# Patient Record
Sex: Male | Born: 1985 | Race: Black or African American | Hispanic: No | Marital: Single | Smoking: Current every day smoker
Health system: Southern US, Community
[De-identification: ages and names within clinical notes are randomized; demographics above are authoritative.]

---
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
## 2005-09-07 ENCOUNTER — Emergency Department (HOSPITAL_COMMUNITY): Admission: EM | Admit: 2005-09-07 | Discharge: 2005-09-07 | Payer: Self-pay | Admitting: Emergency Medicine

## 2007-11-10 ENCOUNTER — Emergency Department (HOSPITAL_COMMUNITY): Admission: EM | Admit: 2007-11-10 | Discharge: 2007-11-10 | Payer: Self-pay | Admitting: Emergency Medicine

## 2007-11-30 ENCOUNTER — Emergency Department (HOSPITAL_COMMUNITY): Admission: EM | Admit: 2007-11-30 | Discharge: 2007-11-30 | Payer: Self-pay | Admitting: Emergency Medicine

## 2010-02-09 ENCOUNTER — Emergency Department (HOSPITAL_COMMUNITY)
Admission: EM | Admit: 2010-02-09 | Discharge: 2010-02-09 | Payer: Self-pay | Source: Home / Self Care | Admitting: Emergency Medicine

## 2010-05-18 LAB — CBC
Hemoglobin: 15.6 g/dL (ref 13.0–17.0)
MCH: 30.8 pg (ref 26.0–34.0)
MCHC: 34.8 g/dL (ref 30.0–36.0)
Platelets: 225 10*3/uL (ref 150–400)
RBC: 5.06 MIL/uL (ref 4.22–5.81)

## 2010-05-18 LAB — URINALYSIS, ROUTINE W REFLEX MICROSCOPIC
Bilirubin Urine: NEGATIVE
Glucose, UA: NEGATIVE mg/dL
Hgb urine dipstick: NEGATIVE
Ketones, ur: NEGATIVE mg/dL
Nitrite: NEGATIVE
Protein, ur: NEGATIVE mg/dL
Specific Gravity, Urine: 1.01 (ref 1.005–1.030)
Urobilinogen, UA: 1 mg/dL (ref 0.0–1.0)
pH: 6.5 (ref 5.0–8.0)

## 2010-05-18 LAB — DIFFERENTIAL
Basophils Relative: 1 % (ref 0–1)
Eosinophils Absolute: 0.2 10*3/uL (ref 0.0–0.7)
Lymphs Abs: 3.1 10*3/uL (ref 0.7–4.0)
Monocytes Relative: 11 % (ref 3–12)
Neutro Abs: 2.6 10*3/uL (ref 1.7–7.7)
Neutrophils Relative %: 39 % — ABNORMAL LOW (ref 43–77)

## 2010-05-18 LAB — BASIC METABOLIC PANEL
CO2: 24 mEq/L (ref 19–32)
Calcium: 9.3 mg/dL (ref 8.4–10.5)
Creatinine, Ser: 0.94 mg/dL (ref 0.4–1.5)
GFR calc Af Amer: >60 mL/min (ref >60)
GFR calc non Af Amer: >60 mL/min (ref >60)
Sodium: 138 mEq/L (ref 135–145)

## 2010-12-07 LAB — CBC
Platelets: 260
RBC: 5.12
WBC: 8.9

## 2010-12-07 LAB — DIFFERENTIAL
Basophils Absolute: 0
Basophils Relative: 0
Eosinophils Absolute: 0.1
Eosinophils Relative: 1
Lymphocytes Relative: 27
Monocytes Absolute: 0.4

## 2010-12-07 LAB — URINALYSIS, ROUTINE W REFLEX MICROSCOPIC
Bilirubin Urine: NEGATIVE
Hgb urine dipstick: NEGATIVE
Ketones, ur: 15 — AB
Specific Gravity, Urine: >1.03 — ABNORMAL HIGH
Urobilinogen, UA: 1

## 2010-12-07 LAB — COMPREHENSIVE METABOLIC PANEL
ALT: 14
AST: 23
Albumin: 4.3
Alkaline Phosphatase: 68
CO2: 27
Chloride: 105
GFR calc Af Amer: >60
GFR calc non Af Amer: >60
Potassium: 3.7
Total Bilirubin: 1.2

## 2010-12-07 LAB — ETHANOL: Alcohol, Ethyl (B): <5

## 2014-09-23 ENCOUNTER — Emergency Department (HOSPITAL_COMMUNITY)
Admission: EM | Admit: 2014-09-23 | Discharge: 2014-09-23 | Disposition: A | Discharge status: Home / Self Care | Payer: BLUE CROSS/BLUE SHIELD | Attending: Emergency Medicine | Admitting: Emergency Medicine

## 2014-09-23 ENCOUNTER — Emergency Department (HOSPITAL_COMMUNITY): Payer: BLUE CROSS/BLUE SHIELD

## 2014-09-23 ENCOUNTER — Encounter (HOSPITAL_COMMUNITY): Payer: Self-pay | Admitting: Emergency Medicine

## 2014-09-23 DIAGNOSIS — S46911A Strain of unspecified muscle, fascia and tendon at shoulder and upper arm level, right arm, initial encounter: Secondary | ICD-10-CM | POA: Diagnosis not present

## 2014-09-23 DIAGNOSIS — Y9289 Other specified places as the place of occurrence of the external cause: Secondary | ICD-10-CM | POA: Insufficient documentation

## 2014-09-23 DIAGNOSIS — Y9389 Activity, other specified: Secondary | ICD-10-CM | POA: Insufficient documentation

## 2014-09-23 DIAGNOSIS — Z720 Tobacco use: Secondary | ICD-10-CM | POA: Diagnosis not present

## 2014-09-23 DIAGNOSIS — X58XXXA Exposure to other specified factors, initial encounter: Secondary | ICD-10-CM | POA: Diagnosis not present

## 2014-09-23 DIAGNOSIS — Y998 Other external cause status: Secondary | ICD-10-CM | POA: Diagnosis not present

## 2014-09-23 DIAGNOSIS — S4991XA Unspecified injury of right shoulder and upper arm, initial encounter: Secondary | ICD-10-CM | POA: Diagnosis present

## 2014-09-23 MED ORDER — NAPROXEN 500 MG PO TABS: 500.0000 mg | ORAL_TABLET | Freq: Two times a day (BID) | ORAL | Status: DC

## 2014-09-23 MED ORDER — NAPROXEN 250 MG PO TABS
500.0000 mg | ORAL_TABLET | Freq: Once | ORAL | Status: AC
  Administered 2014-09-23: 500 mg via ORAL
  Filled 2014-09-23: qty 2

## 2014-09-23 NOTE — Discharge Instructions (Signed)

## 2014-09-23 NOTE — ED Provider Notes (Signed)
CSN: [id number]     Arrival date & time 09/23/14  1459 History  This chart was scribed for non-physician practitioner, Burgess Amor, PA-C working with Gilda Crease, MD by Gwenyth Ober, ED scribe. This patient was seen in room APFT23/APFT23 and the patient's care was started at 4:48 PM   Chief Complaint  Patient presents with  . Shoulder Pain   The history is provided by the patient. No language interpreter was used.    HPI Comments: Dylan Fowler is a 29 y.o. male who presents to the Emergency Department complaining of constant, gradually improving right shoulder pain that started 2 days ago. He states decreased right grip strength as an associated symptom. Pt reports his pain becomes worse with movement. He has tried Tylenol and rest with no relief. Pt works as a Administrator, sports at [employer]. He denies any recent injuries and falls. Pt also denies tingling, numbness, neck or back pain as associated symptoms.  No PCP  History reviewed. No pertinent past medical history. History reviewed. No pertinent past surgical history. History reviewed. No pertinent family history. History  Substance Use Topics  . Smoking status: Current Every Day Smoker -- 0.50 packs/day    Types: Cigarettes  . Smokeless tobacco: Not on file  . Alcohol Use: Yes     Comment: 40 oz daily    Review of Systems  Constitutional: Negative for fever.  Musculoskeletal: Positive for arthralgias. Negative for myalgias and joint swelling.  Neurological: Negative for weakness and numbness.  All other systems reviewed and are negative.  Allergies  Tomato  Home Medications   Prior to Admission medications   Medication Sig Start Date End Date Taking? Authorizing Provider  acetaminophen (TYLENOL) 500 MG tablet Take 500 mg by mouth every 6 (six) hours as needed for mild pain or moderate pain.   Yes Historical Provider, MD  naproxen (NAPROSYN) 500 MG tablet Take 1 tablet  (500 mg total) by mouth 2 (two) times daily. 09/23/14   Burgess Amor, PA-C   BP 155/96 mmHg  Pulse 75  Temp(Src) 98.9 F (37.2 C) (Oral)  Resp 16  Ht  (1.676 m)  Wt 130 lb (58.968 kg)  BMI 20.99 kg/m2  SpO2 100% Physical Exam  Constitutional: He appears well-developed and well-nourished.  HENT:  Head: Atraumatic.  Neck: Normal range of motion.  Cardiovascular: Normal rate.   Pulses equal bilaterally  Musculoskeletal: He exhibits tenderness.  TTP along anterior right shoulder; no palpable deformity; no edema or erythema; no crepitus with ROM; pain is worse with active and passive flexion and abduction; full radial pulse less then 2 s distal cap refill; equal grip strength  Neurological: He is alert. He has normal strength. He displays normal reflexes. No sensory deficit.  Skin: Skin is warm and dry.  Psychiatric: He has a normal mood and affect.  Nursing note and vitals reviewed.   ED Course  Procedures   DIAGNOSTIC STUDIES: Oxygen Saturation is 100% on RA, normal by my interpretation.    COORDINATION OF CARE: 4:54 PM Discussed treatment plan with pt which includes an x-ray of his right shoulder. Advised pt to apply ice, take anti-inflammatories and do light duty for 1 week. Will refer to orthopedist, if no improvement. Pt agreed to plan.  5:56 PM Discussed x-ray results and discharge with instructions with pt. Pt agrees to plan.  Labs Review Labs Reviewed - No data to display  Imaging Review Dg Shoulder Right  09/23/2014  CLINICAL DATA:  Right shoulder pain 2 days.  No injury.  EXAM: RIGHT SHOULDER - 2+ VIEW  COMPARISON:  None.  FINDINGS: There is no evidence of fracture or dislocation. There is no evidence of arthropathy or other focal bone abnormality. Soft tissues are unremarkable.  IMPRESSION: Negative.   Electronically Signed   By: Elberta Fortisaniel  Boyle M.D.   On: 09/23/2014 17:18     EKG Interpretation None      MDM   Final diagnoses:  Shoulder strain, right,  initial encounter    Patients labs and/or radiological studies were reviewed and considered during the medical decision making and disposition process.  Results were also discussed with patient.  Pain associated with chronic overuse of the right shoulder.  Cannot rule out the possibility of a rotator cuff injury which was discussed with patient.  He was prescribed naproxen, encouraged heat therapy to the shoulder.  Minimize overhead use or any activity that worsens his symptoms, he was given a work note to cover him for these restrictions for the next week.  He was referred to orthopedics for further evaluation if his symptoms persist despite treatment or worsen.  Patient understands and agrees with plan.  I personally performed the services described in this documentation, which was scribed in my presence. The recorded information has been reviewed and is accurate.   Burgess AmorJulie Yoseph Haile, PA-C 09/25/14 0031  Burgess AmorJulie Rosely Fernandez, PA-C 09/25/14 0128  Gilda Creasehristopher J Pollina, MD 09/26/14 [phone]

## 2014-09-23 NOTE — ED Notes (Signed)
PT c/o right shoulder pain with ROM x2 days and denies any recent injury.

## 2015-04-07 ENCOUNTER — Emergency Department (HOSPITAL_COMMUNITY): Payer: BLUE CROSS/BLUE SHIELD

## 2015-04-07 ENCOUNTER — Encounter (HOSPITAL_COMMUNITY): Payer: Self-pay | Admitting: Emergency Medicine

## 2015-04-07 ENCOUNTER — Emergency Department (HOSPITAL_COMMUNITY)
Admission: EM | Admit: 2015-04-07 | Discharge: 2015-04-07 | Disposition: A | Discharge status: Home / Self Care | Payer: BLUE CROSS/BLUE SHIELD | Attending: Emergency Medicine | Admitting: Emergency Medicine

## 2015-04-07 DIAGNOSIS — S60221A Contusion of right hand, initial encounter: Secondary | ICD-10-CM | POA: Diagnosis not present

## 2015-04-07 DIAGNOSIS — Y9389 Activity, other specified: Secondary | ICD-10-CM | POA: Diagnosis not present

## 2015-04-07 DIAGNOSIS — W228XXA Striking against or struck by other objects, initial encounter: Secondary | ICD-10-CM | POA: Insufficient documentation

## 2015-04-07 DIAGNOSIS — Y998 Other external cause status: Secondary | ICD-10-CM | POA: Diagnosis not present

## 2015-04-07 DIAGNOSIS — Z791 Long term (current) use of non-steroidal anti-inflammatories (NSAID): Secondary | ICD-10-CM | POA: Diagnosis not present

## 2015-04-07 DIAGNOSIS — Y9289 Other specified places as the place of occurrence of the external cause: Secondary | ICD-10-CM | POA: Insufficient documentation

## 2015-04-07 DIAGNOSIS — F17210 Nicotine dependence, cigarettes, uncomplicated: Secondary | ICD-10-CM | POA: Diagnosis not present

## 2015-04-07 DIAGNOSIS — S6991XA Unspecified injury of right wrist, hand and finger(s), initial encounter: Secondary | ICD-10-CM | POA: Diagnosis present

## 2015-04-07 MED ORDER — ACETAMINOPHEN 325 MG PO TABS
650.0000 mg | ORAL_TABLET | Freq: Once | ORAL | Status: AC
  Administered 2015-04-07: 650 mg via ORAL
  Filled 2015-04-07: qty 2

## 2015-04-07 MED ORDER — IBUPROFEN 800 MG PO TABS
800.0000 mg | ORAL_TABLET | Freq: Once | ORAL | Status: AC
  Administered 2015-04-07: 800 mg via ORAL
  Filled 2015-04-07: qty 1

## 2015-04-07 NOTE — ED Provider Notes (Signed)
CSN: [id number]     Arrival date & time 04/07/15  1632 History  By signing my name below, I, Murriel Hopper, attest that this documentation has been prepared under the direction and in the presence of Ivery Quale, PA-C.  Electronically Signed: Murriel Hopper, ED Scribe. 04/07/2015. 5:00 PM.    Chief Complaint  Patient presents with  . Hand Injury      Patient is a 30 y.o. male presenting with hand injury. The history is provided by the patient. No language interpreter was used.  Hand Injury Location:  Hand Hand location:  R hand and dorsum of R hand Pain details:    Radiates to:  Does not radiate   Severity:  Moderate   Onset quality:  Sudden   Duration:  1 day   Timing:  Constant   Progression:  Unchanged Chronicity:  New Handedness:  Right-handed Dislocation: no   Foreign body present:  No foreign bodies Tetanus status:  Unknown Prior injury to area:  No Relieved by:  None tried Worsened by:  Movement Ineffective treatments:  None tried Associated symptoms: decreased range of motion    HPI Comments: Dylan Fowler is a 30 y.o. male who presents to the Emergency Department complaining of a constant right hand injury that has been present since last night at 2200. Pt reports he punched a metal cabinet last night out of anger, and woke up this morning with swelling to the lateral aspect of his right hand. Pt states his pain worsens when he puts his hand into a fist. Pt denies taking any medications or doing anything to treat his pain prior to coming to ED. Pt denies any other symptoms.  History reviewed. No pertinent past medical history. History reviewed. No pertinent past surgical history. History reviewed. No pertinent family history. Social History  Substance Use Topics  . Smoking status: Current Every Day Smoker -- 0.50 packs/day    Types: Cigarettes  . Smokeless tobacco: None  . Alcohol Use: Yes     Comment: 40 oz daily    Review of Systems  Musculoskeletal:  Positive for myalgias, joint swelling and arthralgias.  All other systems reviewed and are negative.     Allergies  Tomato  Home Medications   Prior to Admission medications   Medication Sig Start Date End Date Taking? Authorizing Provider  acetaminophen (TYLENOL) 500 MG tablet Take 500 mg by mouth every 6 (six) hours as needed for mild pain or moderate pain.    Historical Provider, MD  naproxen (NAPROSYN) 500 MG tablet Take 1 tablet (500 mg total) by mouth 2 (two) times daily. 09/23/14   Burgess Amor, PA-C   BP 134/93 mmHg  Pulse 76  Temp(Src) 100.2 F (37.9 C) (Tympanic)  Resp 14  Ht  (1.727 m)  Wt 130 lb (58.968 kg)  BMI 19.77 kg/m2  SpO2 100% Physical Exam  Constitutional: He is oriented to person, place, and time. He appears well-developed and well-nourished.  HENT:  Head: Normocephalic and atraumatic.  Cardiovascular: Normal rate.   Pulmonary/Chest: Effort normal.  Abdominal: He exhibits no distension.  Musculoskeletal: He exhibits tenderness.  Capillary refill <2 seconds No deformity of PIP joint of fingers of the right hand  Tenderness to palpation of 5th Carpal bone Abrasion to the 4th MP joint Full ROM of wrist No deformity of the forearm or elbow  Neurological: He is alert and oriented to person, place, and time.  Skin: Skin is warm and dry.  Psychiatric: He has a normal  mood and affect.  Nursing note and vitals reviewed.   ED Course  Procedures (including critical care time)  DIAGNOSTIC STUDIES: Oxygen Saturation is 100% on room air, normal by my interpretation.    COORDINATION OF CARE: 4:56 PM Discussed treatment plan with pt at bedside and pt agreed to plan.   Labs Review Labs Reviewed - No data to display  Imaging Review Dg Hand Complete Right  04/07/2015  CLINICAL DATA:  30 year old who punched a metal door with his right hand yesterday. Pain localizing to the 3rd through 5th MCP joints. Initial encounter. EXAM: RIGHT HAND - COMPLETE 3+  VIEW COMPARISON:  None. FINDINGS: No evidence of acute fracture or dislocation. Joint spaces well preserved. Well-preserved bone mineral density. No intrinsic osseous abnormalities. IMPRESSION: Normal examination. Electronically Signed   By: Hulan Saas M.D.   On: 04/07/2015 16:56   I have personally reviewed and evaluated these images and lab results as part of my medical decision-making.    MDM  Xray is negative for fracture or dislocation. Pt has temp of 100.2. Pt advised to monitor this over the next few days.   Final diagnoses:  None  5:20 PM X-ray negative for fracture. Pt advised to ice his hand, and will be given ACE bandage to wrap it up.    **I personally performed the services described in this documentation, which was scribed in my presence. The recorded information has been reviewed and is accurate.Ivery Quale, PA-C 04/07/15 1808  Glynn Octave, MD 04/07/15 [phone]

## 2015-04-07 NOTE — Discharge Instructions (Signed)
Your xray is negative for fracture or dislocation. Your temp is slightly elevated. Please monitor this closely. Use ace wrap for 3 to 5 days. Use ibuprofen and tylenol every 6 hours for discomfort. Hand Contusion  A hand contusion is a deep bruise to the hand. Contusions happen when an injury causes bleeding under the skin. Signs of bruising include pain, puffiness (swelling), and discolored skin. The contusion may turn blue, purple, or yellow. HOME CARE  Put ice on the injured area.  Put ice in a plastic bag.  Place a towel between your skin and the bag.  Leave the ice on for 15-20 minutes, 03-04 times a day.  Only take medicines as told by your doctor.  Use an elastic wrap only as told. You may remove the wrap for sleeping, showering, and bathing. Take the wrap off if you lose feeling (have numbness) in your fingers, or they turn blue or cold. Put the wrap on more loosely.  Keep the hand raised (elevated) with pillows.  Avoid using your hand too much if it is painful. GET HELP RIGHT AWAY IF:   You have more redness, puffiness, or pain in your hand.  Your puffiness or pain does not get better with medicine.  You lose feeling in your hand, or you cannot move your fingers.  Your hand turns cold or blue.  You have pain when you move your fingers.  Your hand feels warm.  Your contusion does not get better in 2 days. MAKE SURE YOU:   Understand these instructions.  Will watch this condition.  Will get help right away if you are not doing well or you get worse.   This information is not intended to replace advice given to you by your health care provider. Make sure you discuss any questions you have with your health care provider.   Document Released: 08/11/2007 Document Revised: 03/15/2014 Document Reviewed: 08/16/2011 Elsevier Interactive Patient Education [employer]

## 2015-04-07 NOTE — ED Notes (Signed)
PA at bedside.

## 2015-04-07 NOTE — ED Notes (Signed)
Pt punched a door last night out of anger. Pt has pain to right hand along third and fouth knuckles, no deformity noted.

## 2015-05-02 ENCOUNTER — Encounter (HOSPITAL_COMMUNITY): Payer: Self-pay | Admitting: Emergency Medicine

## 2015-05-02 ENCOUNTER — Emergency Department (HOSPITAL_COMMUNITY)
Admission: EM | Admit: 2015-05-02 | Discharge: 2015-05-02 | Disposition: A | Discharge status: Home / Self Care | Payer: BLUE CROSS/BLUE SHIELD | Attending: Emergency Medicine | Admitting: Emergency Medicine

## 2015-05-02 DIAGNOSIS — F17210 Nicotine dependence, cigarettes, uncomplicated: Secondary | ICD-10-CM | POA: Diagnosis not present

## 2015-05-02 DIAGNOSIS — K029 Dental caries, unspecified: Secondary | ICD-10-CM | POA: Diagnosis not present

## 2015-05-02 DIAGNOSIS — K0889 Other specified disorders of teeth and supporting structures: Secondary | ICD-10-CM | POA: Diagnosis present

## 2015-05-02 DIAGNOSIS — K047 Periapical abscess without sinus: Secondary | ICD-10-CM

## 2015-05-02 MED ORDER — IBUPROFEN 800 MG PO TABS
800.0000 mg | ORAL_TABLET | Freq: Once | ORAL | Status: AC
  Administered 2015-05-02: 800 mg via ORAL
  Filled 2015-05-02: qty 1

## 2015-05-02 MED ORDER — IBUPROFEN 800 MG PO TABS: 800.0000 mg | ORAL_TABLET | Freq: Three times a day (TID) | ORAL | Status: DC

## 2015-05-02 MED ORDER — ACETAMINOPHEN 325 MG PO TABS
650.0000 mg | ORAL_TABLET | Freq: Once | ORAL | Status: AC
  Administered 2015-05-02: 650 mg via ORAL
  Filled 2015-05-02: qty 2

## 2015-05-02 MED ORDER — LIDOCAINE HCL (PF) 1 % IJ SOLN
INTRAMUSCULAR | Status: AC
  Filled 2015-05-02: qty 5

## 2015-05-02 MED ORDER — TRAMADOL HCL 50 MG PO TABS: 50.0000 mg | ORAL_TABLET | Freq: Four times a day (QID) | ORAL | Status: DC | PRN

## 2015-05-02 MED ORDER — AMOXICILLIN 500 MG PO CAPS: 500.0000 mg | ORAL_CAPSULE | Freq: Three times a day (TID) | ORAL | Status: DC

## 2015-05-02 MED ORDER — CEFTRIAXONE SODIUM 1 G IJ SOLR
1.0000 g | Freq: Once | INTRAMUSCULAR | Status: AC
  Administered 2015-05-02: 1 g via INTRAMUSCULAR
  Filled 2015-05-02: qty 10

## 2015-05-02 NOTE — Discharge Instructions (Signed)
It is important that she see a dentist as soon as possible concerning your dental infection. Use Amoxil and ibuprofen daily with food. Use Ultram for more severe pain. This medication may cause drowsiness, please use with caution. Dental Caries Dental caries is tooth decay. This decay can cause a hole in teeth (cavity) that can get bigger and deeper over time. HOME CARE  Brush and floss your teeth. Do this at least two times a day.  Use a fluoride toothpaste.  Use a mouth rinse if told by your dentist or doctor.  Eat less sugary and starchy foods. Drink less sugary drinks.  Avoid snacking often on sugary and starchy foods. Avoid sipping often on sugary drinks.  Keep regular checkups and cleanings with your dentist.  Use fluoride supplements if told by your dentist or doctor.  Allow fluoride to be applied to teeth if told by your dentist or doctor.   This information is not intended to replace advice given to you by your health care provider. Make sure you discuss any questions you have with your health care provider.   Document Released: 12/02/2007 Document Revised: 03/15/2014 Document Reviewed: 02/25/2012 Elsevier Interactive Patient Education [employer]

## 2015-05-02 NOTE — ED Provider Notes (Signed)
CSN: [id number]     Arrival date & time 05/02/15  1030 History   First MD Initiated Contact with Patient 05/02/15 1308     Chief Complaint  Patient presents with  . Dental Pain     (Consider location/radiation/quality/duration/timing/severity/associated sxs/prior Treatment) Patient is a 29 y.o. male presenting with tooth pain. The history is provided by the patient.  Dental Pain Location:  Upper Severity:  Moderate Onset quality:  Gradual Duration:  3 days Timing:  Intermittent Progression:  Worsening Chronicity:  New Context: dental caries   Context: not recent dental surgery   Relieved by:  Nothing Worsened by:  Nothing tried Associated symptoms: facial pain, facial swelling and gum swelling   Associated symptoms: no fever and no trismus   Risk factors: lack of dental care   Risk factors: no diabetes and no immunosuppression     History reviewed. No pertinent past medical history. History reviewed. No pertinent past surgical history. History reviewed. No pertinent family history. Social History  Substance Use Topics  . Smoking status: Current Every Day Smoker -- 0.50 packs/day    Types: Cigarettes  . Smokeless tobacco: None  . Alcohol Use: Yes     Comment: 40 oz daily    Review of Systems  Constitutional: Negative for fever.  HENT: Positive for dental problem and facial swelling.   All other systems reviewed and are negative.     Allergies  Tomato  Home Medications   Prior to Admission medications   Medication Sig Start Date End Date Taking? Authorizing Provider  acetaminophen (TYLENOL) 500 MG tablet Take 500 mg by mouth every 6 (six) hours as needed for mild pain or moderate pain.    Historical Provider, MD  naproxen (NAPROSYN) 500 MG tablet Take 1 tablet (500 mg total) by mouth 2 (two) times daily. Patient not taking: Reported on 05/02/2015 09/23/14   Burgess Amor, PA-C   BP 128/90 mmHg  Pulse 73  Temp(Src) 98.4 F (36.9 C) (Oral)  Resp 18  Ht   (1.676 m)  Wt 58.968 kg  BMI 20.99 kg/m2  SpO2 100% Physical Exam  Constitutional: He is oriented to person, place, and time. He appears well-developed and well-nourished.  Non-toxic appearance.  HENT:  Head: Normocephalic.  Right Ear: Tympanic membrane and external ear normal.  Left Ear: Tympanic membrane and external ear normal.  There is mild-to-moderate swelling of the right lower jaw.  There is a deep cavity of the right lower molar. The third molar is at an angle. There is swelling of the gum, but no visible abscess. There is no swelling under the tongue. The airway is patent. There is no trismus.  Eyes: EOM and lids are normal. Pupils are equal, round, and reactive to light.  Neck: Normal range of motion. Neck supple. Carotid bruit is not present.  Cardiovascular: Normal rate, regular rhythm, normal heart sounds, intact distal pulses and normal pulses.   Pulmonary/Chest: Breath sounds normal. No respiratory distress.  Abdominal: Soft. Bowel sounds are normal. There is no tenderness. There is no guarding.  Musculoskeletal: Normal range of motion.  Lymphadenopathy:       Head (right side): No submandibular adenopathy present.       Head (left side): No submandibular adenopathy present.    He has no cervical adenopathy.  Neurological: He is alert and oriented to person, place, and time. He has normal strength. No cranial nerve deficit or sensory deficit.  Skin: Skin is warm and dry.  Psychiatric: He has a  normal mood and affect. His speech is normal.  Nursing note and vitals reviewed.   ED Course  Procedures (including critical care time) Labs Review Labs Reviewed - No data to display  Imaging Review No results found. I have personally reviewed and evaluated these images and lab results as part of my medical decision-making.   EKG Interpretation None      MDM  Vital signs reviewed. Patient has dental caries of the right lower jaw. There is swelling of the right lower  face. There is no evidence for Ludwig's angina. airway is patent.  The patient was treated in the emergency department with Rocephin. Prescription for amoxicillin and ibuprofen 800 given to the patient, as well as Ultram.    Final diagnoses:  None    **I have reviewed nursing notes, vital signs, and all appropriate lab and imaging results for this patient.Ivery Quale, PA-C 05/02/15 1333  Bethann Berkshire, MD 05/03/15 [phone]

## 2015-05-02 NOTE — ED Notes (Signed)
C/o right lower dental pain, rates pain 6/10.

## 2015-05-08 ENCOUNTER — Encounter (HOSPITAL_COMMUNITY): Payer: Self-pay | Admitting: *Deleted

## 2015-05-08 ENCOUNTER — Emergency Department (HOSPITAL_COMMUNITY)
Admission: EM | Admit: 2015-05-08 | Discharge: 2015-05-08 | Disposition: A | Discharge status: Home / Self Care | Payer: BLUE CROSS/BLUE SHIELD | Attending: Emergency Medicine | Admitting: Emergency Medicine

## 2015-05-08 DIAGNOSIS — R69 Illness, unspecified: Secondary | ICD-10-CM

## 2015-05-08 DIAGNOSIS — Z791 Long term (current) use of non-steroidal anti-inflammatories (NSAID): Secondary | ICD-10-CM | POA: Insufficient documentation

## 2015-05-08 DIAGNOSIS — M791 Myalgia: Secondary | ICD-10-CM | POA: Diagnosis present

## 2015-05-08 DIAGNOSIS — Z792 Long term (current) use of antibiotics: Secondary | ICD-10-CM | POA: Insufficient documentation

## 2015-05-08 DIAGNOSIS — F17210 Nicotine dependence, cigarettes, uncomplicated: Secondary | ICD-10-CM | POA: Insufficient documentation

## 2015-05-08 DIAGNOSIS — J111 Influenza due to unidentified influenza virus with other respiratory manifestations: Secondary | ICD-10-CM | POA: Diagnosis not present

## 2015-05-08 NOTE — Discharge Instructions (Signed)

## 2015-05-08 NOTE — ED Provider Notes (Signed)
CSN: [id number]     Arrival date & time 05/08/15  0704 History   First MD Initiated Contact with Patient 05/08/15 [phone]     Chief Complaint  Patient presents with  . Generalized Body Aches     (Consider location/radiation/quality/duration/timing/severity/associated sxs/prior Treatment) HPI   This is a previously healthy 30 year old male comes in today complaining of onset of chills, headache, body aches, sore throat, nasal congestion, and cough which began yesterday. He was at work and was able to work through his shift. He states he does not feel like going to work today. His son has had similar symptoms. He has not had a flu shot. He states when he tried to smoke a cigarette today he did not feel like it could smoke it. His cough is non-productive and he denies dyspnea. He denies any chest pain. He denies abdominal pain, nausea, vomiting or diarrhea. He has not taken any intervention. He has been trying to drink plenty of fluids to feel like he is staying hydrated.  History reviewed. No pertinent past medical history. History reviewed. No pertinent past surgical history. No family history on file. Social History  Substance Use Topics  . Smoking status: Current Every Day Smoker -- 0.50 packs/day    Types: Cigarettes  . Smokeless tobacco: None  . Alcohol Use: Yes     Comment: 40 oz daily    Review of Systems  All other systems reviewed and are negative.     Allergies  Tomato  Home Medications   Prior to Admission medications   Medication Sig Start Date End Date Taking? Authorizing Provider  acetaminophen (TYLENOL) 500 MG tablet Take 500 mg by mouth every 6 (six) hours as needed for mild pain or moderate pain.    Historical Provider, MD  amoxicillin (AMOXIL) 500 MG capsule Take 1 capsule (500 mg total) by mouth 3 (three) times daily. 05/02/15   Ivery Quale, PA-C  ibuprofen (ADVIL,MOTRIN) 800 MG tablet Take 1 tablet (800 mg total) by mouth 3 (three) times daily. 05/02/15   Ivery Quale, PA-C  naproxen (NAPROSYN) 500 MG tablet Take 1 tablet (500 mg total) by mouth 2 (two) times daily. Patient not taking: Reported on 05/02/2015 09/23/14   Burgess Amor, PA-C  traMADol (ULTRAM) 50 MG tablet Take 1 tablet (50 mg total) by mouth every 6 (six) hours as needed. 05/02/15   Ivery Quale, PA-C   BP 128/90 mmHg  Pulse 86  Temp(Src) 99.5 F (37.5 C) (Oral)  Resp 16  Ht  (1.676 m)  Wt 58.968 kg  BMI 20.99 kg/m2  SpO2 99% Physical Exam  Constitutional: He is oriented to person, place, and time. He appears well-developed and well-nourished.  HENT:  Head: Normocephalic and atraumatic.  Right Ear: External ear normal.  Left Ear: External ear normal.  Nose: Nose normal.  Eyes: Conjunctivae and EOM are normal. Pupils are equal, round, and reactive to light.  Neck: Normal range of motion. Neck supple. No tracheal deviation present. No thyromegaly present.  Cardiovascular: Normal rate, regular rhythm, normal heart sounds and intact distal pulses.   Pulmonary/Chest: Effort normal and breath sounds normal. No respiratory distress. He has no wheezes. He has no rales. He exhibits no tenderness.  Musculoskeletal: Normal range of motion.  Neurological: He is alert and oriented to person, place, and time.  Skin: Skin is warm and dry.  Psychiatric: He has a normal mood and affect. His behavior is normal. Judgment and thought content normal.  Nursing note and vitals  reviewed.   ED Course  Procedures (including critical care time) Labs Review Labs Reviewed - No data to display  MDM   Final diagnoses:  Influenza-like illness   30 year old male with recent exposure to similar infectious type symptoms. Currently there is high prevalence of flu in the county. He is advised regarding conservative therapy including acetaminophen, by mouth fluids, abstention from smoking, rest. He is advised regarding return precautions such as dyspnea, abdominal pain, or inability to keep fluids. 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 Questions asked and answered.    Margarita Grizzle, MD 05/08/15 0900

## 2015-05-08 NOTE — ED Notes (Signed)
Pt states he woke up yesterday morning with chills, headache, body ache, cough which is productive at times, yellow in color. States nausea when trying to smoke.

## 2015-06-24 ENCOUNTER — Emergency Department (HOSPITAL_COMMUNITY)
Admission: EM | Admit: 2015-06-24 | Discharge: 2015-06-24 | Disposition: A | Discharge status: Home / Self Care | Payer: BLUE CROSS/BLUE SHIELD | Attending: Emergency Medicine | Admitting: Emergency Medicine

## 2015-06-24 ENCOUNTER — Encounter (HOSPITAL_COMMUNITY): Payer: Self-pay | Admitting: *Deleted

## 2015-06-24 DIAGNOSIS — K089 Disorder of teeth and supporting structures, unspecified: Secondary | ICD-10-CM | POA: Insufficient documentation

## 2015-06-24 DIAGNOSIS — R112 Nausea with vomiting, unspecified: Secondary | ICD-10-CM | POA: Insufficient documentation

## 2015-06-24 DIAGNOSIS — Z792 Long term (current) use of antibiotics: Secondary | ICD-10-CM | POA: Diagnosis not present

## 2015-06-24 DIAGNOSIS — R531 Weakness: Secondary | ICD-10-CM | POA: Insufficient documentation

## 2015-06-24 DIAGNOSIS — F1010 Alcohol abuse, uncomplicated: Secondary | ICD-10-CM | POA: Diagnosis not present

## 2015-06-24 DIAGNOSIS — R197 Diarrhea, unspecified: Secondary | ICD-10-CM | POA: Diagnosis not present

## 2015-06-24 DIAGNOSIS — R1010 Upper abdominal pain, unspecified: Secondary | ICD-10-CM | POA: Diagnosis present

## 2015-06-24 DIAGNOSIS — F17210 Nicotine dependence, cigarettes, uncomplicated: Secondary | ICD-10-CM | POA: Diagnosis not present

## 2015-06-24 LAB — URINE MICROSCOPIC-ADD ON
BACTERIA UA: NONE SEEN
RBC / HPF: NONE SEEN RBC/hpf (ref 0–5)
WBC UA: NONE SEEN WBC/hpf (ref 0–5)

## 2015-06-24 LAB — URINALYSIS, ROUTINE W REFLEX MICROSCOPIC
Bilirubin Urine: NEGATIVE
GLUCOSE, UA: NEGATIVE mg/dL
HGB URINE DIPSTICK: NEGATIVE
LEUKOCYTES UA: NEGATIVE
Nitrite: NEGATIVE
Specific Gravity, Urine: >1.03 — ABNORMAL HIGH (ref 1.005–1.030)
pH: 5.5 (ref 5.0–8.0)

## 2015-06-24 LAB — COMPREHENSIVE METABOLIC PANEL
ALK PHOS: 65 U/L (ref 38–126)
ALT: 17 U/L (ref 17–63)
AST: 28 U/L (ref 15–41)
Albumin: 4.2 g/dL (ref 3.5–5.0)
Anion gap: 11 (ref 5–15)
BILIRUBIN TOTAL: 0.7 mg/dL (ref 0.3–1.2)
BUN: 7 mg/dL (ref 6–20)
CALCIUM: 8.7 mg/dL — AB (ref 8.9–10.3)
CO2: 22 mmol/L (ref 22–32)
CREATININE: 0.74 mg/dL (ref 0.61–1.24)
Chloride: 107 mmol/L (ref 101–111)
Glucose, Bld: 90 mg/dL (ref 65–99)
Potassium: 4 mmol/L (ref 3.5–5.1)
Sodium: 140 mmol/L (ref 135–145)
TOTAL PROTEIN: 7.8 g/dL (ref 6.5–8.1)

## 2015-06-24 LAB — RAPID URINE DRUG SCREEN, HOSP PERFORMED
Amphetamines: NOT DETECTED
BENZODIAZEPINES: NOT DETECTED
Barbiturates: NOT DETECTED
Cocaine: NOT DETECTED
Opiates: NOT DETECTED
Tetrahydrocannabinol: NOT DETECTED

## 2015-06-24 LAB — CBC WITH DIFFERENTIAL/PLATELET
BASOS ABS: 0.1 10*3/uL (ref 0.0–0.1)
BASOS PCT: 2 %
EOS ABS: 0.2 10*3/uL (ref 0.0–0.7)
EOS PCT: 3 %
HCT: 45.9 % (ref 39.0–52.0)
Hemoglobin: 15.2 g/dL (ref 13.0–17.0)
LYMPHS PCT: 47 %
Lymphs Abs: 3.2 10*3/uL (ref 0.7–4.0)
MCH: 30.5 pg (ref 26.0–34.0)
MCHC: 33.1 g/dL (ref 30.0–36.0)
MCV: 92.2 fL (ref 78.0–100.0)
MONO ABS: 0.7 10*3/uL (ref 0.1–1.0)
Monocytes Relative: 11 %
Neutro Abs: 2.5 10*3/uL (ref 1.7–7.7)
Neutrophils Relative %: 37 %
PLATELETS: 280 10*3/uL (ref 150–400)
RBC: 4.98 MIL/uL (ref 4.22–5.81)
RDW: 12.1 % (ref 11.5–15.5)
WBC: 6.7 10*3/uL (ref 4.0–10.5)

## 2015-06-24 LAB — LIPASE, BLOOD: LIPASE: 21 U/L (ref 11–51)

## 2015-06-24 LAB — ETHANOL: ALCOHOL ETHYL (B): 55 mg/dL — AB (ref <5)

## 2015-06-24 MED ORDER — ONDANSETRON HCL 4 MG PO TABS: 4.0000 mg | ORAL_TABLET | Freq: Four times a day (QID) | ORAL | Status: DC

## 2015-06-24 MED ORDER — FAMOTIDINE 20 MG PO TABS: 20.0000 mg | ORAL_TABLET | Freq: Two times a day (BID) | ORAL | Status: DC

## 2015-06-24 MED ORDER — ONDANSETRON HCL 4 MG/2ML IJ SOLN
4.0000 mg | Freq: Once | INTRAMUSCULAR | Status: AC
  Administered 2015-06-24: 4 mg via INTRAVENOUS
  Filled 2015-06-24: qty 2

## 2015-06-24 MED ORDER — FAMOTIDINE IN NACL 20-0.9 MG/50ML-% IV SOLN
20.0000 mg | Freq: Once | INTRAVENOUS | Status: AC
  Administered 2015-06-24: 20 mg via INTRAVENOUS
  Filled 2015-06-24: qty 50

## 2015-06-24 MED ORDER — SODIUM CHLORIDE 0.9 % IV BOLUS (SEPSIS)
1000.0000 mL | Freq: Once | INTRAVENOUS | Status: AC
  Administered 2015-06-24: 1000 mL via INTRAVENOUS

## 2015-06-24 NOTE — ED Notes (Signed)
Pt c/o abd pain with n/v/d. Reports he is taking PCN for dental infection. And has been exposed to "stomach bug" recently.

## 2015-06-24 NOTE — ED Provider Notes (Signed)
CSN: [id number]     Arrival date & time 06/24/15  4098 History   First MD Initiated Contact with Patient 06/24/15 [phone]     No chief complaint on file.    (Consider location/radiation/quality/duration/timing/severity/associated sxs/prior Treatment) HPI   Dylan Fowler is a 30 y.o. male who presents to the Emergency Department complaining of generalized weakness and upper abdominal pain for two days.  He reports having episodic vomiting and diarrhea as well.  He describes the abdominal pain as sharp and worse with movement and attempted fluid or food intake.  He also states that another family member recently had similar symptoms.  He also states that he has been taking PCN for a dental infection and he admits to daily alcohol intake.  He states that he drinks more than 40 oz of beer daily.  He denies fever, bloody or black stools or vomitus, chest pain, back pain or dysuria.     No past medical history on file. No past surgical history on file. No family history on file. Social History  Substance Use Topics  . Smoking status: Current Every Day Smoker -- 0.50 packs/day    Types: Cigarettes  . Smokeless tobacco: Not on file  . Alcohol Use: Yes     Comment: 40 oz daily    Review of Systems  Constitutional: Positive for chills and appetite change. Negative for fever.  HENT: Positive for dental problem. Negative for congestion and sore throat.   Respiratory: Negative for chest tightness and shortness of breath.   Cardiovascular: Negative for chest pain.  Gastrointestinal: Positive for nausea, vomiting, abdominal pain and diarrhea. Negative for blood in stool and abdominal distention.  Genitourinary: Negative for dysuria, flank pain, decreased urine volume and difficulty urinating.  Musculoskeletal: Negative for back pain.  Skin: Negative for color change and rash.  Neurological: Positive for weakness (generalized). Negative for dizziness, syncope, speech difficulty, numbness and  headaches.  Hematological: Negative for adenopathy.  All other systems reviewed and are negative.     Allergies  Tomato  Home Medications   Prior to Admission medications   Medication Sig Start Date End Date Taking? Authorizing Provider  acetaminophen (TYLENOL) 500 MG tablet Take 500 mg by mouth every 6 (six) hours as needed for mild pain or moderate pain.    Historical Provider, MD  amoxicillin (AMOXIL) 500 MG capsule Take 1 capsule (500 mg total) by mouth 3 (three) times daily. 05/02/15   Ivery Quale, PA-C  ibuprofen (ADVIL,MOTRIN) 800 MG tablet Take 1 tablet (800 mg total) by mouth 3 (three) times daily. 05/02/15   Ivery Quale, PA-C  naproxen (NAPROSYN) 500 MG tablet Take 1 tablet (500 mg total) by mouth 2 (two) times daily. Patient not taking: Reported on 05/02/2015 09/23/14   Burgess Amor, PA-C  traMADol (ULTRAM) 50 MG tablet Take 1 tablet (50 mg total) by mouth every 6 (six) hours as needed. 05/02/15   Ivery Quale, PA-C   BP 152/98 mmHg  Pulse 85  Temp(Src) 98.3 F (36.8 C) (Oral)  Resp 16  Ht  (1.676 m)  Wt 56.7 kg  BMI 20.19 kg/m2  SpO2 98% Physical Exam  Constitutional: He is oriented to person, place, and time. He appears well-developed and well-nourished. No distress.  Pt smells of ETOH  HENT:  Head: Normocephalic and atraumatic.  Mouth/Throat: Oropharynx is clear and moist.  Cardiovascular: Normal rate, regular rhythm, normal heart sounds and intact distal pulses.   No murmur heard. Pulmonary/Chest: Effort normal and breath sounds normal. No  respiratory distress.  Abdominal: Soft. Normal appearance. He exhibits no distension and no mass. There is no hepatosplenomegaly. There is tenderness in the epigastric area. There is no rigidity, no rebound, no guarding, no CVA tenderness and no tenderness at McBurney's point.  Musculoskeletal: Normal range of motion. He exhibits no edema.  Neurological: He is alert and oriented to person, place, and time. He exhibits  normal muscle tone. Coordination normal.  Skin: Skin is warm and dry.  Psychiatric: His behavior is normal.  Nursing note and vitals reviewed.   ED Course  Procedures (including critical care time) Labs Review Labs Reviewed  COMPREHENSIVE METABOLIC PANEL - Abnormal; Notable for the following:    Calcium 8.7 (*)    All other components within normal limits  ETHANOL - Abnormal; Notable for the following:    Alcohol, Ethyl (B) 55 (*)    All other components within normal limits  URINALYSIS, ROUTINE W REFLEX MICROSCOPIC (NOT AT [facility]) - Abnormal; Notable for the following:    Specific Gravity, Urine >1.030 (*)    Ketones, ur TRACE (*)    Protein, ur TRACE (*)    All other components within normal limits  URINE MICROSCOPIC-ADD ON - Abnormal; Notable for the following:    Squamous Epithelial / LPF 0-5 (*)    All other components within normal limits  LIPASE, BLOOD  CBC WITH DIFFERENTIAL/PLATELET  URINE RAPID DRUG SCREEN, HOSP PERFORMED    Imaging Review No results found. I have personally reviewed and evaluated these images and lab results as part of my medical decision-making.   EKG Interpretation None      MDM   Final diagnoses:  Non-intractable vomiting with nausea, vomiting of unspecified type  Alcohol abuse   0945  Pt remains well appearing.  Vitals stable.  No active vomiting in the ED.  abd remains soft, labs are reassuring.  Mother now at bedside.  Will try fluid challenge.    1050  Pt is feeling better and states he is ready to go home.  Will be discharge in mother's care.  No concerning sx's for surgical abdomen.  Counseled on alcohol abuse , referral info given for treatment centers.  Agrees to return if needed.   Pauline Ausammy Marcio Hoque, PA-C 06/26/15 2130  Zadie Rhineonald Wickline, MD 06/27/15 [phone]

## 2015-06-24 NOTE — ED Notes (Signed)
Pt tolerating po fluids (8oz ginger ale) well.

## 2015-06-24 NOTE — Discharge Instructions (Signed)
Nausea and Vomiting Nausea means you feel sick to your stomach. Throwing up (vomiting) is a reflex where stomach contents come out of your mouth. HOME CARE   Take medicine as told by your doctor.  Do not force yourself to eat. However, you do need to drink fluids.  If you feel like eating, eat a normal diet as told by your doctor.  Eat rice, wheat, potatoes, bread, lean meats, yogurt, fruits, and vegetables.  Avoid high-fat foods.  Drink enough fluids to keep your pee (urine) clear or pale yellow.  Ask your doctor how to replace body fluid losses (rehydrate). Signs of body fluid loss (dehydration) include:  Feeling very thirsty.  Dry lips and mouth.  Feeling dizzy.  Dark pee.  Peeing less than normal.  Feeling confused.  Fast breathing or heart rate. GET HELP RIGHT AWAY IF:   You have blood in your throw up.  You have black or bloody poop (stool).  You have a bad headache or stiff neck.  You feel confused.  You have bad belly (abdominal) pain.  You have chest pain or trouble breathing.  You do not pee at least once every 8 hours.  You have cold, clammy skin.  You keep throwing up after 24 to 48 hours.  You have a fever. MAKE SURE YOU:   Understand these instructions.  Will watch your condition.  Will get help right away if you are not doing well or get worse.   This information is not intended to replace advice given to you by your health care provider. Make sure you discuss any questions you have with your health care provider.   Document Released: 08/11/2007 Document Revised: 05/17/2011 Document Reviewed: 07/24/2010 Elsevier Interactive Patient Education 2016 [employer].  Alcohol Use Disorder Alcohol use disorder is a mental disorder. It is not a one-time incident of heavy drinking. Alcohol use disorder is the excessive and uncontrollable use of alcohol over time that leads to problems with functioning in one or more areas of daily living. People  with this disorder risk harming themselves and others when they drink to excess. Alcohol use disorder also can cause other mental disorders, such as mood and anxiety disorders, and serious physical problems. People with alcohol use disorder often misuse other drugs.  Alcohol use disorder is common and widespread. Some people with this disorder drink alcohol to cope with or escape from negative life events. Others drink to relieve chronic pain or symptoms of mental illness. People with a family history of alcohol use disorder are at higher risk of losing control and using alcohol to excess.  Drinking too much alcohol can cause injury, accidents, and health problems. One drink can be too much when you are:  Working.  Pregnant or breastfeeding.  Taking medicines. Ask your doctor.  Driving or planning to drive. SYMPTOMS  Signs and symptoms of alcohol use disorder may include the following:   Consumption ofalcohol inlarger amounts or over a longer period of time than intended.  Multiple unsuccessful attempts to cutdown or control alcohol use.   A great deal of time spent obtaining alcohol, using alcohol, or recovering from the effects of alcohol (hangover).  A strong desire or urge to use alcohol (cravings).   Continued use of alcohol despite problems at work, school, or home because of alcohol use.   Continued use of alcohol despite problems in relationships because of alcohol use.  Continued use of alcohol in situations when it is physically hazardous, such as driving a  car.  Continued use of alcohol despite awareness of a physical or psychological problem that is likely related to alcohol use. Physical problems related to alcohol use can involve the brain, heart, liver, stomach, and intestines. Psychological problems related to alcohol use include intoxication, depression, anxiety, psychosis, delirium, and dementia.   The need for increased amounts of alcohol to achieve the same  desired effect, or a decreased effect from the consumption of the same amount of alcohol (tolerance).  Withdrawal symptoms upon reducing or stopping alcohol use, or alcohol use to reduce or avoid withdrawal symptoms. Withdrawal symptoms include:  Racing heart.  Hand tremor.  Difficulty sleeping.  Nausea.  Vomiting.  Hallucinations.  Restlessness.  Seizures. DIAGNOSIS Alcohol use disorder is diagnosed through an assessment by your health care provider. Your health care provider may start by asking three or four questions to screen for excessive or problematic alcohol use. To confirm a diagnosis of alcohol use disorder, at least two symptoms must be present within a 85-month period. The severity of alcohol use disorder depends on the number of symptoms:  Mild--two or three.  Moderate--four or five.  Severe--six or more. Your health care provider may perform a physical exam or use results from lab tests to see if you have physical problems resulting from alcohol use. Your health care provider may refer you to a mental health professional for evaluation. TREATMENT  Some people with alcohol use disorder are able to reduce their alcohol use to low-risk levels. Some people with alcohol use disorder need to quit drinking alcohol. When necessary, mental health professionals with specialized training in substance use treatment can help. Your health care provider can help you decide how severe your alcohol use disorder is and what type of treatment you need. The following forms of treatment are available:   Detoxification. Detoxification involves the use of prescription medicines to prevent alcohol withdrawal symptoms in the first week after quitting. This is important for people with a history of symptoms of withdrawal and for heavy drinkers who are likely to have withdrawal symptoms. Alcohol withdrawal can be dangerous and, in severe cases, cause death. Detoxification is usually provided in a  hospital or in-patient substance use treatment facility.  Counseling or talk therapy. Talk therapy is provided by substance use treatment counselors. It addresses the reasons people use alcohol and ways to keep them from drinking again. The goals of talk therapy are to help people with alcohol use disorder find healthy activities and ways to cope with life stress, to identify and avoid triggers for alcohol use, and to handle cravings, which can cause relapse.  Medicines.Different medicines can help treat alcohol use disorder through the following actions:  Decrease alcohol cravings.  Decrease the positive reward response felt from alcohol use.  Produce an uncomfortable physical reaction when alcohol is used (aversion therapy).  Support groups. Support groups are run by people who have quit drinking. They provide emotional support, advice, and guidance. These forms of treatment are often combined. Some people with alcohol use disorder benefit from intensive combination treatment provided by specialized substance use treatment centers. Both inpatient and outpatient treatment programs are available.   This information is not intended to replace advice given to you by your health care provider. Make sure you discuss any questions you have with your health care provider.   Document Released: 04/01/2004 Document Revised: 03/15/2014 Document Reviewed: 06/01/2012 Elsevier Interactive Patient Education 2016 [employer]. [employer] Guide Outpatient Counseling/Substance Abuse Adult The United Ways 211 is a  great source of information about community services available.  Access by dialing 2-1-1 from anywhere in [place], or by website -  PooledIncome.pl.   Other Local Resources (Updated 03/2015)  Crisis Hotlines   Services     Area Served  [employer]  Crisis Hotline, available 24 hours a day, 7 days a week: 872-028-3441 [facility], [place]    [facility]  Crisis Hotline, available 24 hours a day, 7 days a week: 817-521-4228 [facility], [place]  [facility]  Suicide Prevention Hotline, available 24 hours a day, 7 days a week: (539)297-7356 [facility], [place]  [employer], available 24 hours a day, 7 days a week: 705-040-6606 [facility], [place]   [facility] Access to [employer], available 24 hours a day, 7 days a week: 970-398-8166 All   [facility]  Crisis Hotline, available 24 hours a day, 7 days a week: 910-524-6540 All   Other Local Resources (Updated 03/2015)  Outpatient Counseling/ Substance Abuse Programs  Services     Address and Phone Number  ADS (Alcohol and Drug Services)   Options include Individual counseling, group counseling, intensive outpatient program (several hours a day, several days a week)  Offers depression assessments  Provides methadone maintenance program [phone] [address] [place], [place] [address]   [facility]   Offers partial hospitalization/day treatment and DUI/DWI programs  [employer], private insurance [phone] [address], [address] [place], [place] [address]  [facility]    Services include intensive outpatient program (several hours a day, several days a week), outpatient treatment, DUI/DWI services, family education  Also has some services specifically for [employer] transitional housing  [phone] [address] [place], [place] [address]     [facility]  [employer], private pay, and private insurance [phone] [address] [place], [place] [address]  [employer] [facility]  Services include individual counseling, substance abuse intensive outpatient program (several hours a day, several days a week), day treatment  Delene Loll, Medicaid, private insurance [phone] [address] [place], [place] [address]  [facility]   Offers substance abuse intensive outpatient program (several hours a day, several days a week), partial hospitalization program [phone] [address] [place], [place] [address]  [phone] [address] [place], [place] [address]  [phone] [address] [place], [place] [address]  [phone], [address] [place], [place] [address]  [facility]  Individual counseling only  Accepts private insurance only [phone] [address] [place], [place] [address]  [facility]  Methadone maintenance program [phone] [address] [place], [place] [address]  [facility]  Walk-In Clinic providing substance abuse and mental health counseling  Accepts Medicaid, Medicare, private insurance  Offers sliding scale for uninsured [phone] [address] [place], [place]   [facility] [place], [employer].  Offers individual counseling, and intensive in-home services [phone] [address] [place], [place] [address]  [facility] individual counseling, family counseling, group therapy, domestic violence counseling, consumer credit counseling  Accepts Medicare, Medicaid, private insurance  Offers sliding scale for uninsured [phone] [address] [place], [place] [address]  [phone] [facility], [address] [place], [place] [address]  [facility]  Offers individual, family and group counseling  3 locations - [place], [place], and [place]  [phone]  [address] [place], [place] [address]  [address] [place], [place] [address]  [address] [place], [place] [address]  [facility]    Offers psychiatric assessment, 8-week Intensive Outpatient Program (several hours a day, several times a week, daytime or evenings), early recovery group,  family Program, medication management  Private pay or private insurance only [phone], or  [phone] [address] [place], [place] [address]  [facility]  Offers individual, couples and family counseling  Accepts Medicaid, private insurance, and sliding scale for uninsured [phone] [address] [place], [place] [address]  Len Blalockavid Fuller, MD  Individual counseling  Private insurance [phone] [address] [place], [place] [address]  [facility]   Offers assessment, substance abuse treatment, and behavioral health treatment [phone] [address] [place], [place] [address]  [facility]  Individual counseling  Accepts private insurance [phone] [address] [place], [place] [address]  [facility]  Individual counseling  Delene Lollccepts Medicare, private insurance [phone] [place], [place] [address]  [facility]    Offers intensive outpatient program (several hours a day, several times a week)  Private pay, private insurance [phone] [facility] [address] [facility] [place], [place]  [facility]  Individual counseling  Medicare, private insurance [phone] [address], [address] [place], [place] [address]  [facility]    Offers intensive outpatient program (several hours a day, several times a week) and partial hospitalization program [phone] [address] [place], [place] [address]  Emerson MonteParrish McKinney, MD  Individual counseling [phone] [address], [address] [place], [place] [address]  [facility]  Offers Christian counseling to individuals, couples, and families  Accepts Medicare and private insurance; offers sliding scale for uninsured [phone] [address] [place], [place] [address]  [facility]  [facility]  [phone] [address], [address] [place], [place] [address]  [facility]   Offers crisis counseling, individual counseling, group therapy, in-home therapy, domestic violence services, day treatment, DWI services, Administrator, artsCommunity Support Team (CST), Assertive Community Treatment Team (ACTT), substance abuse Intensive Outpatient Program (several hours a day, several times a week)  2 locations - [place] [phone] [address] [place], [place] [address]  [phone] [address] [place], [place] [address]  [facility]     Individual counseling and group therapy  Accepts private insurance, [place], [place] [phone] [address] [place], [place]  [facility]  Offers individual and family counseling  Offers LGBTQ services  Accepts private insurance and private pay [phone] [address] [place], [place] [address]  [facility]    Offers individual counseling, group therapy, and outpatient detox  Accepts private insurance [phone] [address] [place], [place]  [facility]  Individual counseling  Accepts Medicare, private insurance [phone] [address] [place], [place] [address]  [facility]  Individual counseling  Accepts Medicare, private insurance [phone] [address] [place], [place] [address]  [facility]   Offers substance abuse Intensive Outpatient Program (several hours a day, several times a week) [phone], or [phone] [place], [place]

## 2015-08-27 ENCOUNTER — Emergency Department (HOSPITAL_COMMUNITY)
Admission: EM | Admit: 2015-08-27 | Discharge: 2015-08-27 | Disposition: A | Discharge status: Home / Self Care | Payer: BLUE CROSS/BLUE SHIELD | Attending: Emergency Medicine | Admitting: Emergency Medicine

## 2015-08-27 ENCOUNTER — Encounter (HOSPITAL_COMMUNITY): Payer: Self-pay

## 2015-08-27 DIAGNOSIS — F17210 Nicotine dependence, cigarettes, uncomplicated: Secondary | ICD-10-CM | POA: Diagnosis not present

## 2015-08-27 DIAGNOSIS — M545 Low back pain, unspecified: Secondary | ICD-10-CM

## 2015-08-27 LAB — COMPREHENSIVE METABOLIC PANEL
ALT: 16 U/L — ABNORMAL LOW (ref 17–63)
ANION GAP: 7 (ref 5–15)
AST: 31 U/L (ref 15–41)
Albumin: 4.3 g/dL (ref 3.5–5.0)
Alkaline Phosphatase: 66 U/L (ref 38–126)
BUN: 8 mg/dL (ref 6–20)
CALCIUM: 8.7 mg/dL — AB (ref 8.9–10.3)
CO2: 25 mmol/L (ref 22–32)
Chloride: 106 mmol/L (ref 101–111)
Creatinine, Ser: 0.71 mg/dL (ref 0.61–1.24)
GLUCOSE: 76 mg/dL (ref 65–99)
POTASSIUM: 4.1 mmol/L (ref 3.5–5.1)
Sodium: 138 mmol/L (ref 135–145)
TOTAL PROTEIN: 7.4 g/dL (ref 6.5–8.1)
Total Bilirubin: 1.2 mg/dL (ref 0.3–1.2)

## 2015-08-27 LAB — CBC WITH DIFFERENTIAL/PLATELET
BASOS ABS: 0 10*3/uL (ref 0.0–0.1)
BASOS PCT: 1 %
EOS ABS: 0.1 10*3/uL (ref 0.0–0.7)
EOS PCT: 1 %
HCT: 48.1 % (ref 39.0–52.0)
Hemoglobin: 16.1 g/dL (ref 13.0–17.0)
LYMPHS PCT: 47 %
Lymphs Abs: 4.1 10*3/uL — ABNORMAL HIGH (ref 0.7–4.0)
MCH: 31.1 pg (ref 26.0–34.0)
MCHC: 33.5 g/dL (ref 30.0–36.0)
MCV: 93 fL (ref 78.0–100.0)
MONO ABS: 0.9 10*3/uL (ref 0.1–1.0)
Monocytes Relative: 11 %
Neutro Abs: 3.4 10*3/uL (ref 1.7–7.7)
Neutrophils Relative %: 40 %
Platelets: 259 10*3/uL (ref 150–400)
RBC: 5.17 MIL/uL (ref 4.22–5.81)
RDW: 12.3 % (ref 11.5–15.5)
WBC: 8.5 10*3/uL (ref 4.0–10.5)

## 2015-08-27 LAB — URINALYSIS, ROUTINE W REFLEX MICROSCOPIC
BILIRUBIN URINE: NEGATIVE
GLUCOSE, UA: NEGATIVE mg/dL
Hgb urine dipstick: NEGATIVE
LEUKOCYTES UA: NEGATIVE
NITRITE: NEGATIVE
PROTEIN: NEGATIVE mg/dL
Specific Gravity, Urine: >1.03 — ABNORMAL HIGH (ref 1.005–1.030)
pH: 5.5 (ref 5.0–8.0)

## 2015-08-27 MED ORDER — NAPROXEN 500 MG PO TABS: 500.0000 mg | ORAL_TABLET | Freq: Two times a day (BID) | ORAL | Status: DC

## 2015-08-27 MED ORDER — HYDROCODONE-ACETAMINOPHEN 5-325 MG PO TABS
1.0000 | ORAL_TABLET | Freq: Once | ORAL | Status: AC
  Administered 2015-08-27: 1 via ORAL
  Filled 2015-08-27: qty 1

## 2015-08-27 MED ORDER — METHOCARBAMOL 500 MG PO TABS
500.0000 mg | ORAL_TABLET | Freq: Once | ORAL | Status: AC
  Administered 2015-08-27: 500 mg via ORAL
  Filled 2015-08-27: qty 1

## 2015-08-27 MED ORDER — HYDROCODONE-ACETAMINOPHEN 5-325 MG PO TABS: ORAL_TABLET | ORAL | Status: DC

## 2015-08-27 MED ORDER — CYCLOBENZAPRINE HCL 10 MG PO TABS: 10.0000 mg | ORAL_TABLET | Freq: Three times a day (TID) | ORAL | Status: DC | PRN

## 2015-08-27 NOTE — Discharge Instructions (Signed)

## 2015-08-27 NOTE — ED Notes (Signed)
Pt made aware to return if symptoms worsen or if any life threatening symptoms occur.   

## 2015-08-27 NOTE — ED Notes (Signed)
Pt reports that his lower  Right back has been hurting for 2 days and when he voids it causes pain in lower abdomen

## 2015-08-27 NOTE — ED Notes (Signed)
Tammy, PA at bedside.  

## 2015-08-27 NOTE — ED Notes (Signed)
Lab at bedside collecting blood

## 2015-08-29 NOTE — ED Provider Notes (Signed)
CSN: [id number]     Arrival date & time 08/27/15  1042 History   First MD Initiated Contact with Patient 08/27/15 1336     Chief Complaint  Patient presents with  . Back Pain     (Consider location/radiation/quality/duration/timing/severity/associated sxs/prior Treatment) HPI   Dylan Fowler is a 30 y.o. male who presents to the Emergency Department complaining of diffuse low back pain after helping someone move furniture two days ago.  He describes an aching pain in his lower back.  He also reports intermittent pain to his right groin with voiding.  Pain also worse with movement and improves with rest.  He has not tried any therapies or medications for sx relief.  He denies dysuria, hematuria, incontinence or retention of urine or bowel, numbness, weakness or pain to the LE's.    History reviewed. No pertinent past medical history. History reviewed. No pertinent past surgical history. No family history on file. Social History  Substance Use Topics  . Smoking status: Current Every Day Smoker -- 0.50 packs/day    Types: Cigarettes  . Smokeless tobacco: None  . Alcohol Use: Yes     Comment: 40 oz daily    Review of Systems  Constitutional: Negative for fever.  Respiratory: Negative for shortness of breath.   Gastrointestinal: Negative for nausea, vomiting, abdominal pain and constipation.  Genitourinary: Negative for dysuria, hematuria, flank pain, decreased urine volume, scrotal swelling, difficulty urinating and testicular pain.  Musculoskeletal: Positive for back pain. Negative for joint swelling.  Skin: Negative for rash.  Neurological: Negative for weakness and numbness.  All other systems reviewed and are negative.     Allergies  Tomato  Home Medications   Prior to Admission medications   Medication Sig Start Date End Date Taking? Authorizing Provider  amoxicillin (AMOXIL) 500 MG capsule Take 1 capsule (500 mg total) by mouth 3 (three) times daily. Patient not  taking: Reported on 06/24/2015 05/02/15   Ivery QualeHobson Bryant, PA-C  cyclobenzaprine (FLEXERIL) 10 MG tablet Take 1 tablet (10 mg total) by mouth 3 (three) times daily as needed. 08/27/15   Bernon Arviso, PA-C  famotidine (PEPCID) 20 MG tablet Take 1 tablet (20 mg total) by mouth 2 (two) times daily. 06/24/15   Gwendy Boeder, PA-C  HYDROcodone-acetaminophen (NORCO/VICODIN) 5-325 MG tablet Take one tab po q 4-6 hrs prn pain 08/27/15   Rowin Bayron, PA-C  ibuprofen (ADVIL,MOTRIN) 800 MG tablet Take 1 tablet (800 mg total) by mouth 3 (three) times daily. Patient not taking: Reported on 06/24/2015 05/02/15   Ivery QualeHobson Bryant, PA-C  naproxen (NAPROSYN) 500 MG tablet Take 1 tablet (500 mg total) by mouth 2 (two) times daily with a meal. 08/27/15   Ishmel Acevedo, PA-C  ondansetron (ZOFRAN) 4 MG tablet Take 1 tablet (4 mg total) by mouth every 6 (six) hours. As needed for vomiting 06/24/15   Paisely Brick, PA-C  penicillin v potassium (VEETID) 500 MG tablet Take 500 mg by mouth 4 (four) times daily. 7 day course starting on 06/16/15 06/16/15   Historical Provider, MD  traMADol (ULTRAM) 50 MG tablet Take 1 tablet (50 mg total) by mouth every 6 (six) hours as needed. Patient not taking: Reported on 06/24/2015 05/02/15   Ivery QualeHobson Bryant, PA-C   BP 127/74 mmHg  Pulse 85  Temp(Src) 98.2 F (36.8 C) (Oral)  Resp 16  Ht 5\' 6"  (1.676 m)  Wt 56.7 kg  BMI 20.19 kg/m2  SpO2 100% Physical Exam  Constitutional: He is oriented to person, place, and time.  He appears well-developed and well-nourished. No distress.  HENT:  Head: Normocephalic and atraumatic.  Neck: Normal range of motion. Neck supple.  Cardiovascular: Normal rate, regular rhythm, normal heart sounds and intact distal pulses.   No murmur heard. Pulmonary/Chest: Effort normal and breath sounds normal. No respiratory distress. He exhibits no tenderness.  Abdominal: Soft. He exhibits no distension. There is no tenderness. There is no rebound and no guarding.   Musculoskeletal: He exhibits tenderness. He exhibits no edema.       Lumbar back: He exhibits tenderness and pain. He exhibits normal range of motion, no swelling, no deformity, no laceration and normal pulse.  ttp of the bilateral lumbar paraspinal muscles.  No spinal tenderness.  Pain reproduced with SLR on right.  DP pulses are brisk and symmetrical.  Distal sensation intact.  Pt has 5/5 strength against resistance of bilateral lower extremities.     Neurological: He is alert and oriented to person, place, and time. He has normal strength. No sensory deficit. He exhibits normal muscle tone. Coordination and gait normal.  Reflex Scores:      Patellar reflexes are 2+ on the right side and 2+ on the left side.      Achilles reflexes are 2+ on the right side and 2+ on the left side. Skin: Skin is warm and dry. No rash noted.  Nursing note and vitals reviewed.   ED Course  Procedures (including critical care time) Labs Review Labs Reviewed  URINALYSIS, ROUTINE W REFLEX MICROSCOPIC (NOT AT [facility]) - Abnormal; Notable for the following:    Specific Gravity, Urine >1.030 (*)    Ketones, ur TRACE (*)    All other components within normal limits  COMPREHENSIVE METABOLIC PANEL - Abnormal; Notable for the following:    Calcium 8.7 (*)    ALT 16 (*)    All other components within normal limits  CBC WITH DIFFERENTIAL/PLATELET - Abnormal; Notable for the following:    Lymphs Abs 4.1 (*)    All other components within normal limits    Imaging Review No results found. I have personally reviewed and evaluated these images and lab results as part of my medical decision-making.   EKG Interpretation None      MDM   Final diagnoses:  Bilateral low back pain without sciatica    Pt well appearing, NV intact.  No focal neuro deficits.  Labs reassuring.  No abdominal pain on exam.  No concerning sx's for emergent neurological or infectious process.  Ambulates with steady gait.  Likely  musculoskeletal .  Pt appears stable for d/c and agrees to return precautions given.    Discharge Medication List as of 08/27/2015  3:42 PM    START taking these medications   Details  cyclobenzaprine (FLEXERIL) 10 MG tablet Take 1 tablet (10 mg total) by mouth 3 (three) times daily as needed., Starting 08/27/2015, Until Discontinued, Print    HYDROcodone-acetaminophen (NORCO/VICODIN) 5-325 MG tablet Take one tab po q 4-6 hrs prn pain, Print    naproxen (NAPROSYN) 500 MG tablet Take 1 tablet (500 mg total) by mouth 2 (two) times daily with a meal., Starting 08/27/2015, Until Discontinued, Print           Pauline Ausammy Sherrian Nunnelley, PA-C 08/29/15 1238  Loren Raceravid Yelverton, MD 08/30/15 [phone]

## 2015-09-17 ENCOUNTER — Emergency Department (HOSPITAL_COMMUNITY)
Admission: EM | Admit: 2015-09-17 | Discharge: 2015-09-17 | Disposition: A | Discharge status: Home / Self Care | Payer: BLUE CROSS/BLUE SHIELD | Attending: Emergency Medicine | Admitting: Emergency Medicine

## 2015-09-17 ENCOUNTER — Encounter (HOSPITAL_COMMUNITY): Payer: Self-pay | Admitting: Emergency Medicine

## 2015-09-17 DIAGNOSIS — T675XXA Heat exhaustion, unspecified, initial encounter: Secondary | ICD-10-CM | POA: Insufficient documentation

## 2015-09-17 DIAGNOSIS — Z792 Long term (current) use of antibiotics: Secondary | ICD-10-CM | POA: Diagnosis not present

## 2015-09-17 DIAGNOSIS — F17210 Nicotine dependence, cigarettes, uncomplicated: Secondary | ICD-10-CM | POA: Insufficient documentation

## 2015-09-17 DIAGNOSIS — Z79899 Other long term (current) drug therapy: Secondary | ICD-10-CM | POA: Diagnosis not present

## 2015-09-17 DIAGNOSIS — R531 Weakness: Secondary | ICD-10-CM | POA: Diagnosis not present

## 2015-09-17 DIAGNOSIS — Z791 Long term (current) use of non-steroidal anti-inflammatories (NSAID): Secondary | ICD-10-CM | POA: Insufficient documentation

## 2015-09-17 LAB — URINALYSIS, ROUTINE W REFLEX MICROSCOPIC
Bilirubin Urine: NEGATIVE
Glucose, UA: NEGATIVE mg/dL
Ketones, ur: NEGATIVE mg/dL
LEUKOCYTES UA: NEGATIVE
NITRITE: NEGATIVE
PROTEIN: NEGATIVE mg/dL
SPECIFIC GRAVITY, URINE: 1.01 (ref 1.005–1.030)
pH: 5.5 (ref 5.0–8.0)

## 2015-09-17 LAB — CBC WITH DIFFERENTIAL/PLATELET
BASOS ABS: 0.1 10*3/uL (ref 0.0–0.1)
BASOS PCT: 1 %
EOS ABS: 0.2 10*3/uL (ref 0.0–0.7)
EOS PCT: 2 %
HCT: 41.9 % (ref 39.0–52.0)
HEMOGLOBIN: 14.8 g/dL (ref 13.0–17.0)
LYMPHS ABS: 4.9 10*3/uL — AB (ref 0.7–4.0)
Lymphocytes Relative: 54 %
MCH: 32.2 pg (ref 26.0–34.0)
MCHC: 35.3 g/dL (ref 30.0–36.0)
MCV: 91.1 fL (ref 78.0–100.0)
Monocytes Absolute: 1 10*3/uL (ref 0.1–1.0)
Monocytes Relative: 10 %
NEUTROS PCT: 33 %
Neutro Abs: 3 10*3/uL (ref 1.7–7.7)
PLATELETS: 178 10*3/uL (ref 150–400)
RBC: 4.6 MIL/uL (ref 4.22–5.81)
RDW: 12 % (ref 11.5–15.5)
WBC: 9.1 10*3/uL (ref 4.0–10.5)

## 2015-09-17 LAB — BASIC METABOLIC PANEL
Anion gap: 7 (ref 5–15)
BUN: 8 mg/dL (ref 6–20)
CHLORIDE: 107 mmol/L (ref 101–111)
CO2: 22 mmol/L (ref 22–32)
CREATININE: 0.65 mg/dL (ref 0.61–1.24)
Calcium: 8.4 mg/dL — ABNORMAL LOW (ref 8.9–10.3)
Glucose, Bld: 100 mg/dL — ABNORMAL HIGH (ref 65–99)
Potassium: 3.1 mmol/L — ABNORMAL LOW (ref 3.5–5.1)
SODIUM: 136 mmol/L (ref 135–145)

## 2015-09-17 LAB — RAPID URINE DRUG SCREEN, HOSP PERFORMED
AMPHETAMINES: NOT DETECTED
Barbiturates: NOT DETECTED
Benzodiazepines: NOT DETECTED
COCAINE: NOT DETECTED
OPIATES: NOT DETECTED
TETRAHYDROCANNABINOL: NOT DETECTED

## 2015-09-17 LAB — ETHANOL: Alcohol, Ethyl (B): 155 mg/dL — ABNORMAL HIGH (ref <5)

## 2015-09-17 LAB — URINE MICROSCOPIC-ADD ON
BACTERIA UA: NONE SEEN
Squamous Epithelial / LPF: NONE SEEN
WBC UA: NONE SEEN WBC/hpf (ref 0–5)

## 2015-09-17 LAB — TSH: TSH: 3.029 u[IU]/mL (ref 0.350–4.500)

## 2015-09-17 LAB — MAGNESIUM: Magnesium: 2 mg/dL (ref 1.7–2.4)

## 2015-09-17 MED ORDER — SODIUM CHLORIDE 0.9 % IV BOLUS (SEPSIS)
1000.0000 mL | Freq: Once | INTRAVENOUS | Status: AC
  Administered 2015-09-17: 1000 mL via INTRAVENOUS

## 2015-09-17 NOTE — ED Notes (Signed)
MD at bedside. 

## 2015-09-17 NOTE — ED Notes (Signed)
PT stated [place] and got lightheaded and stated he feels fatigued today. PT denies any pain, n/v/d and is drinking water on arrival to ED.

## 2015-09-17 NOTE — Discharge Instructions (Signed)
Heat Exhaustion Information WHAT IS HEAT EXHAUSTION? Heat exhaustion happens when your body gets overheated from hot weather or from exercise. Heat exhaustion makes the temperature of your skin and body go up. Your body cools itself by sweating. If you do not drink enough water to replace what you sweat, you lose too much water and salt. This makes it harder for your body to produce more sweat. When you do not sweat enough, your body cannot cool down, and heat exhaustion may result. Heat exhaustion can lead to heatstroke, which is a more serious illness. WHO IS AT RISK FOR HEAT EXHAUSTION? Anyone can get heat exhaustion. However, heat exhaustion is more likely when you are exercising or doing a physical activity. It is also more likely when you are in hot and humid weather or bright sunshine. Heat exhaustion is also more likely to develop in:  People who are age 65 or older.  Children.  People who have a medical condition such as heart disease, poor circulation, sickle cell disease, or high blood pressure.  People who have a fever.  People who are very overweight (obese).  People who are dehydrated from:  Drinking alcohol or caffeine.  Taking certain medicines, such as diuretics or stimulants. WHAT ARE THE SYMPTOMS OF HEAT EXHAUSTION? Symptoms of heat exhaustion include:  A body temperature of up to 104F (40C).  Moist, cool, and clammy skin.  Dizziness.  Headache.  Nausea.  Fatigue.  Thirst.  Dark-colored urine.  Rapid pulse or heartbeat.  Weakness.  Muscle cramps.  Confusion.  Fainting. WHAT SHOULD I DO IF I THINK I HAVE HEAT EXHAUSTION? If you think that you have heat exhaustion, call your health care provider. Follow his or her instructions. You should also:  Call a friend or a family member and ask someone to stay with you.  Move to a cooler location, such as:  Into the shade.  In front of a fan.  Someplace that has air conditioning.  Lie down  and rest.  Slowly drink nonalcoholic, caffeine-free fluids.  Take off any extra clothing or tight-fitting clothes.  Take a cool bath or shower, if possible. If you do not have access to a bath or shower, dab or mist cool water on your skin. WHY IS IT IMPORTANT TO TREAT HEAT EXHAUSTION? It is extremely important to take care of yourself and treat heat exhaustion as soon as possible. Untreated heat exhaustion can turn into heatstroke. Symptoms of heatstroke include:  A body temperature of 104F (40C) or higher.  Hot, red skin that may be dry or moist.  Severe headache.  Nausea and vomiting.  Muscle weakness and cramping.  Confusion.  Rapid breathing.  Fainting.  Seizure. These symptoms may represent a serious problem that is an emergency. Do not wait to see if the symptoms will go away. Get medical help right away. Call your local emergency services (911 in the [place] Do not drive yourself to the hospital. Heatstroke is a life-threatening condition that requires urgent medical treatment. Do not treat heatstroke at home. Heatstroke should be treated by a health care professional and may require hospitalization. At the hospital, you may need to receive fluids through an IV tube:  If you cannot drink any fluids.  If you vomit any fluids that you drink.  If your symptoms do not get better after one hour.  If your symptoms get worse after one hour. HOW CAN I PREVENT HEAT EXHAUSTION?  Avoid outdoor activities on very hot or humid days.    Do not exercise or do other physical activity when you are not feeling well.  Drink plenty of nonalcoholic and caffeine-free fluids before and during physical activity.  Take frequent breaks for rest during physical activity.  Wear light-colored, loose-fitting, and lightweight clothing in the heat.  Wear a hat and use sunscreen when exercising outdoors.  Avoid being outside during the hottest times of the day.  Check with your health  care provider before you start any new activity, especially if you take medicine or have a medical condition.  Start any new activity slowly and work up to your fitness level. HOW CAN I HELP TO PROTECT ELDERLY RELATIVES AND NEIGHBORS FROM HEAT EXHAUSTION? People who are age 65 or older are at greater risk for heat exhaustion. Their bodies have a harder time adjusting to heat. They are also more likely to have a medical condition or be on medicines that increase their risk for heat exhaustion. They may get heat exhaustion indoors if the heat is high for several days. You can help to protect them during hot weather by:  Checking on them two or more times each day.  Making sure that they are drinking plenty of cool, nonalcoholic, and caffeine-free fluids.  Making sure that they use their air conditioner.  Taking them to a location where air conditioning is available.  Talking with their health care provider about their medical needs, medicines, and fluid requirements.   This information is not intended to replace advice given to you by your health care provider. Make sure you discuss any questions you have with your health care provider.   Document Released: 12/02/2007 Document Revised: 11/13/2014 Document Reviewed: 01/30/2014 Elsevier Interactive Patient Education 2016 [employer]  

## 2015-09-17 NOTE — ED Provider Notes (Signed)
CSN: [id number]     Arrival date & time 09/17/15  0708 History   First MD Initiated Contact with Patient 09/17/15 [phone]     Chief Complaint  Patient presents with  . Heat Exposure   Pt is a 30 yo BM [place]90 [place] for a few hrs.  He did not drink any water, but did have some soda.  He said that he came home and drank a "couple 40s" and has been feeling very light headed today.  Pt said that he has no energy and feels like he might pass out.  He denies any pain.  (Consider location/radiation/quality/duration/timing/severity/associated sxs/prior Treatment) The history is provided by the patient.    History reviewed. No pertinent past medical history. History reviewed. No pertinent past surgical history. History reviewed. No pertinent family history. Social History  Substance Use Topics  . Smoking status: Current Every Day Smoker -- 0.50 packs/day    Types: Cigarettes  . Smokeless tobacco: None  . Alcohol Use: Yes     Comment: 40 oz daily    Review of Systems  Neurological: Positive for weakness.  All other systems reviewed and are negative.     Allergies  Tomato  Home Medications   Prior to Admission medications   Medication Sig Start Date End Date Taking? Authorizing Provider  amoxicillin (AMOXIL) 500 MG capsule Take 1 capsule (500 mg total) by mouth 3 (three) times daily. Patient not taking: Reported on 06/24/2015 05/02/15   Ivery Quale, PA-C  cyclobenzaprine (FLEXERIL) 10 MG tablet Take 1 tablet (10 mg total) by mouth 3 (three) times daily as needed. 08/27/15   Tammy Triplett, PA-C  famotidine (PEPCID) 20 MG tablet Take 1 tablet (20 mg total) by mouth 2 (two) times daily. 06/24/15   Tammy Triplett, PA-C  HYDROcodone-acetaminophen (NORCO/VICODIN) 5-325 MG tablet Take one tab po q 4-6 hrs prn pain 08/27/15   Tammy Triplett, PA-C  ibuprofen (ADVIL,MOTRIN) 800 MG tablet Take 1 tablet (800 mg total) by mouth 3 (three) times daily. Patient not  taking: Reported on 06/24/2015 05/02/15   Ivery Quale, PA-C  naproxen (NAPROSYN) 500 MG tablet Take 1 tablet (500 mg total) by mouth 2 (two) times daily with a meal. 08/27/15   Tammy Triplett, PA-C  ondansetron (ZOFRAN) 4 MG tablet Take 1 tablet (4 mg total) by mouth every 6 (six) hours. As needed for vomiting 06/24/15   Tammy Triplett, PA-C  penicillin v potassium (VEETID) 500 MG tablet Take 500 mg by mouth 4 (four) times daily. 7 day course starting on 06/16/15 06/16/15   Historical Provider, MD  traMADol (ULTRAM) 50 MG tablet Take 1 tablet (50 mg total) by mouth every 6 (six) hours as needed. Patient not taking: Reported on 06/24/2015 05/02/15   Ivery Quale, PA-C   BP 130/89 mmHg  Pulse 81  Temp(Src) 97.7 F (36.5 C) (Oral)  Resp 14  Ht  (1.676 m)  Wt 130 lb (58.968 kg)  BMI 20.99 kg/m2  SpO2 99% Physical Exam  Constitutional: He is oriented to person, place, and time. He appears well-developed and well-nourished.  HENT:  Head: Normocephalic and atraumatic.  Right Ear: External ear normal.  Left Ear: External ear normal.  Nose: Nose normal.  Mouth/Throat: Oropharynx is clear and moist.  Eyes: Conjunctivae and EOM are normal. Pupils are equal, round, and reactive to light.  Neck: Normal range of motion. Neck supple.  Cardiovascular: Normal rate, regular rhythm, normal heart sounds and intact distal  pulses.   Pulmonary/Chest: Effort normal and breath sounds normal.  Abdominal: Soft. Bowel sounds are normal.  Musculoskeletal: Normal range of motion.  Neurological: He is alert and oriented to person, place, and time.  Skin: Skin is warm and dry.  Psychiatric: He has a normal mood and affect. His behavior is normal. Judgment and thought content normal.  Nursing note and vitals reviewed.   ED Course  Procedures (including critical care time) Labs Review Labs Reviewed  BASIC METABOLIC PANEL - Abnormal; Notable for the following:    Potassium 3.1 (*)    Glucose, Bld 100 (*)     Calcium 8.4 (*)    All other components within normal limits  CBC WITH DIFFERENTIAL/PLATELET - Abnormal; Notable for the following:    Lymphs Abs 4.9 (*)    All other components within normal limits  URINALYSIS, ROUTINE W REFLEX MICROSCOPIC (NOT AT [facility]) - Abnormal; Notable for the following:    Hgb urine dipstick SMALL (*)    All other components within normal limits  ETHANOL - Abnormal; Notable for the following:    Alcohol, Ethyl (B) 155 (*)    All other components within normal limits  URINE RAPID DRUG SCREEN, HOSP PERFORMED  MAGNESIUM  URINE MICROSCOPIC-ADD ON  TSH    Imaging Review No results found. I have personally reviewed and evaluated these images and lab results as part of my medical decision-making.   EKG Interpretation None      MDM  Pt is feeling much better after 1L of NS.  I spoke to him about avoiding etoh if he is going to be in the hot sun.  He is encouraged to drink lots of water if he is going to be outside in the heat.  Pt requested a note for work and is given one.  Pt knows to return here if sx worsen. Final diagnoses:  Heat exhaustion, initial encounter       Jacalyn LefevreJulie Medina Degraffenreid, MD 09/17/15 [phone]

## 2017-02-09 ENCOUNTER — Emergency Department (HOSPITAL_COMMUNITY)
Admission: EM | Admit: 2017-02-09 | Discharge: 2017-02-09 | Disposition: A | Discharge status: Home / Self Care | Payer: BLUE CROSS/BLUE SHIELD | Attending: Emergency Medicine | Admitting: Emergency Medicine

## 2017-02-09 ENCOUNTER — Encounter (HOSPITAL_COMMUNITY): Payer: Self-pay | Admitting: Emergency Medicine

## 2017-02-09 ENCOUNTER — Other Ambulatory Visit: Payer: Self-pay

## 2017-02-09 DIAGNOSIS — F17210 Nicotine dependence, cigarettes, uncomplicated: Secondary | ICD-10-CM | POA: Insufficient documentation

## 2017-02-09 DIAGNOSIS — R112 Nausea with vomiting, unspecified: Secondary | ICD-10-CM

## 2017-02-09 DIAGNOSIS — Z79899 Other long term (current) drug therapy: Secondary | ICD-10-CM | POA: Insufficient documentation

## 2017-02-09 DIAGNOSIS — R197 Diarrhea, unspecified: Secondary | ICD-10-CM | POA: Insufficient documentation

## 2017-02-09 LAB — BASIC METABOLIC PANEL
ANION GAP: 9 (ref 5–15)
BUN: 8 mg/dL (ref 6–20)
CALCIUM: 9.1 mg/dL (ref 8.9–10.3)
CO2: 24 mmol/L (ref 22–32)
Chloride: 103 mmol/L (ref 101–111)
Creatinine, Ser: 0.68 mg/dL (ref 0.61–1.24)
Glucose, Bld: 89 mg/dL (ref 65–99)
Potassium: 3.8 mmol/L (ref 3.5–5.1)
SODIUM: 136 mmol/L (ref 135–145)

## 2017-02-09 MED ORDER — ONDANSETRON 8 MG PO TBDP: 8.0000 mg | ORAL_TABLET | Freq: Three times a day (TID) | ORAL | 0 refills | Status: DC | PRN

## 2017-02-09 MED ORDER — SODIUM CHLORIDE 0.9 % IV BOLUS (SEPSIS)
1000.0000 mL | Freq: Once | INTRAVENOUS | Status: AC
  Administered 2017-02-09: 1000 mL via INTRAVENOUS

## 2017-02-09 MED ORDER — ONDANSETRON HCL 4 MG/2ML IJ SOLN
4.0000 mg | Freq: Once | INTRAMUSCULAR | Status: AC
  Administered 2017-02-09: 4 mg via INTRAVENOUS
  Filled 2017-02-09: qty 2

## 2017-02-09 NOTE — ED Triage Notes (Signed)
Pt c/o body aches/weakness/chills/congestion/n/v/d x 2 days.

## 2017-02-09 NOTE — ED Provider Notes (Signed)
[facility] EMERGENCY DEPARTMENT Provider Note   CSN: [id number] Arrival date & time: 02/09/17  1715     History   Chief Complaint Chief Complaint  Patient presents with  . Emesis    HPI Dylan Fowler is a 31 y.o. male.  HPI Patient presents to the emergency room for evaluation of nausea vomiting and diarrhea associated congestion.  Patient states he has not felt well the past couple of days.  He has not been able to go to work.  He has had a few episodes of vomiting each day as well as multiple episodes of diarrhea.  He has had some cough and nasal congestion.  He is felt chilled and other times he is felt warm and flushed.  He has not measured any fevers.  He does not feel short of breath.  No rashes.  No recent travel.  He was recently exposed to family members that had a viral illness. History reviewed. No pertinent past medical history.  There are no active problems to display for this patient.   History reviewed. No pertinent surgical history.     Home Medications    Prior to Admission medications   Medication Sig Start Date End Date Taking? Authorizing Provider  amoxicillin (AMOXIL) 500 MG capsule Take 1 capsule (500 mg total) by mouth 3 (three) times daily. Patient not taking: Reported on 06/24/2015 05/02/15   Ivery QualeBryant, Hobson, PA-C  cyclobenzaprine (FLEXERIL) 10 MG tablet Take 1 tablet (10 mg total) by mouth 3 (three) times daily as needed. 08/27/15   Triplett, Tammy, PA-C  famotidine (PEPCID) 20 MG tablet Take 1 tablet (20 mg total) by mouth 2 (two) times daily. 06/24/15   Triplett, Tammy, PA-C  HYDROcodone-acetaminophen (NORCO/VICODIN) 5-325 MG tablet Take one tab po q 4-6 hrs prn pain 08/27/15   Triplett, Tammy, PA-C  ibuprofen (ADVIL,MOTRIN) 800 MG tablet Take 1 tablet (800 mg total) by mouth 3 (three) times daily. Patient not taking: Reported on 06/24/2015 05/02/15   Ivery QualeBryant, Hobson, PA-C  naproxen (NAPROSYN) 500 MG tablet Take 1 tablet (500 mg total) by mouth 2 (two)  times daily with a meal. 08/27/15   Triplett, Tammy, PA-C  ondansetron (ZOFRAN ODT) 8 MG disintegrating tablet Take 1 tablet (8 mg total) by mouth every 8 (eight) hours as needed for nausea or vomiting. 02/09/17   Linwood DibblesKnapp, Gerrick Ray, MD  ondansetron (ZOFRAN) 4 MG tablet Take 1 tablet (4 mg total) by mouth every 6 (six) hours. As needed for vomiting 06/24/15   Triplett, Tammy, PA-C  penicillin v potassium (VEETID) 500 MG tablet Take 500 mg by mouth 4 (four) times daily. 7 day course starting on 06/16/15 06/16/15   [provider]  traMADol (ULTRAM) 50 MG tablet Take 1 tablet (50 mg total) by mouth every 6 (six) hours as needed. Patient not taking: Reported on 06/24/2015 05/02/15   Ivery QualeBryant, Hobson, PA-C    Family History No family history on file.  Social History Social History   Tobacco Use  . Smoking status: Current Every Day Smoker    Packs/day: 0.50    Types: Cigarettes  . Smokeless tobacco: Never Used  Substance Use Topics  . Alcohol use: Yes    Comment: 40 oz daily  . Drug use: No     Allergies   Tomato   Review of Systems Review of Systems  All other systems reviewed and are negative.    Physical Exam Updated Vital Signs BP (!) 164/87 (BP Location: Left Arm)   Pulse 92  Temp 98.2 F (36.8 C) (Oral)   Resp 18   Wt 56.7 kg (125 lb)   SpO2 98%   BMI 20.18 kg/m   Physical Exam  Constitutional: He appears well-developed and well-nourished. No distress.  HENT:  Head: Normocephalic and atraumatic.  Right Ear: External ear normal.  Left Ear: External ear normal.  Eyes: Conjunctivae are normal. Right eye exhibits no discharge. Left eye exhibits no discharge. No scleral icterus.  Neck: Neck supple. No tracheal deviation present.  Cardiovascular: Normal rate, regular rhythm and intact distal pulses.  Pulmonary/Chest: Effort normal and breath sounds normal. No stridor. No respiratory distress. He has no wheezes. He has no rales.  Abdominal: Soft. Bowel sounds are normal.  He exhibits no distension. There is no tenderness. There is no rebound and no guarding.  Musculoskeletal: He exhibits no edema or tenderness.  Neurological: He is alert. He has normal strength. No cranial nerve deficit (no facial droop, extraocular movements intact, no slurred speech) or sensory deficit. He exhibits normal muscle tone. He displays no seizure activity. Coordination normal.  Skin: Skin is warm and dry. No rash noted.  Psychiatric: He has a normal mood and affect.  Nursing note and vitals reviewed.    ED Treatments / Results  Labs (all labs ordered are listed, but only abnormal results are displayed) Labs Reviewed  BASIC METABOLIC PANEL    Procedures Procedures (including critical care time)  Medications Ordered in ED Medications  sodium chloride 0.9 % bolus 1,000 mL (0 mLs Intravenous Stopped 02/09/17 1840)  ondansetron (ZOFRAN) injection 4 mg (4 mg Intravenous Given 02/09/17 1752)     Initial Impression / Assessment and Plan / ED Course  I have reviewed the triage vital signs and the nursing notes.  Pertinent labs & imaging results that were available during my care of the patient were reviewed by me and considered in my medical decision making (see chart for details).   The patient's physical exam is reassuring.  Lungs are clear.  I do not hear any pulmonary findings to suggest pneumonia.  I suspect the patient's symptoms are related to a viral illness.  His electrolytes do not show any signs of severe dehydration.  Patient was given a liter of IV fluids and Zofran for nausea.  He is feeling better and ready to go home.  Final Clinical Impressions(s) / ED Diagnoses   Final diagnoses:  Nausea vomiting and diarrhea    ED Discharge Orders        Ordered    ondansetron (ZOFRAN ODT) 8 MG disintegrating tablet  Every 8 hours PRN     02/09/17 1904       Linwood DibblesKnapp, Shakiah Wester, MD 02/09/17 1906

## 2017-02-09 NOTE — ED Notes (Signed)
Achy lower back and right shoulder

## 2017-02-09 NOTE — Discharge Instructions (Signed)
Take the medications as needed for nausea and vomiting, you can also take over-the-counter Imodium to help with your diarrhea.  Follow-up with primary doctor if not better in the next week .  return as needed for worsening symptoms

## 2017-06-11 IMAGING — DX DG HAND COMPLETE 3+V*R*
3 series · 3 of 3 positions shown · non-contrast
Comparison: None.

CLINICAL DATA: 29-year-old who punched a metal door with his right
hand yesterday. Pain localizing to the 3rd through 5th MCP joints.
Initial encounter.

EXAM:
RIGHT HAND - COMPLETE 3+ VIEW

[hand pa]
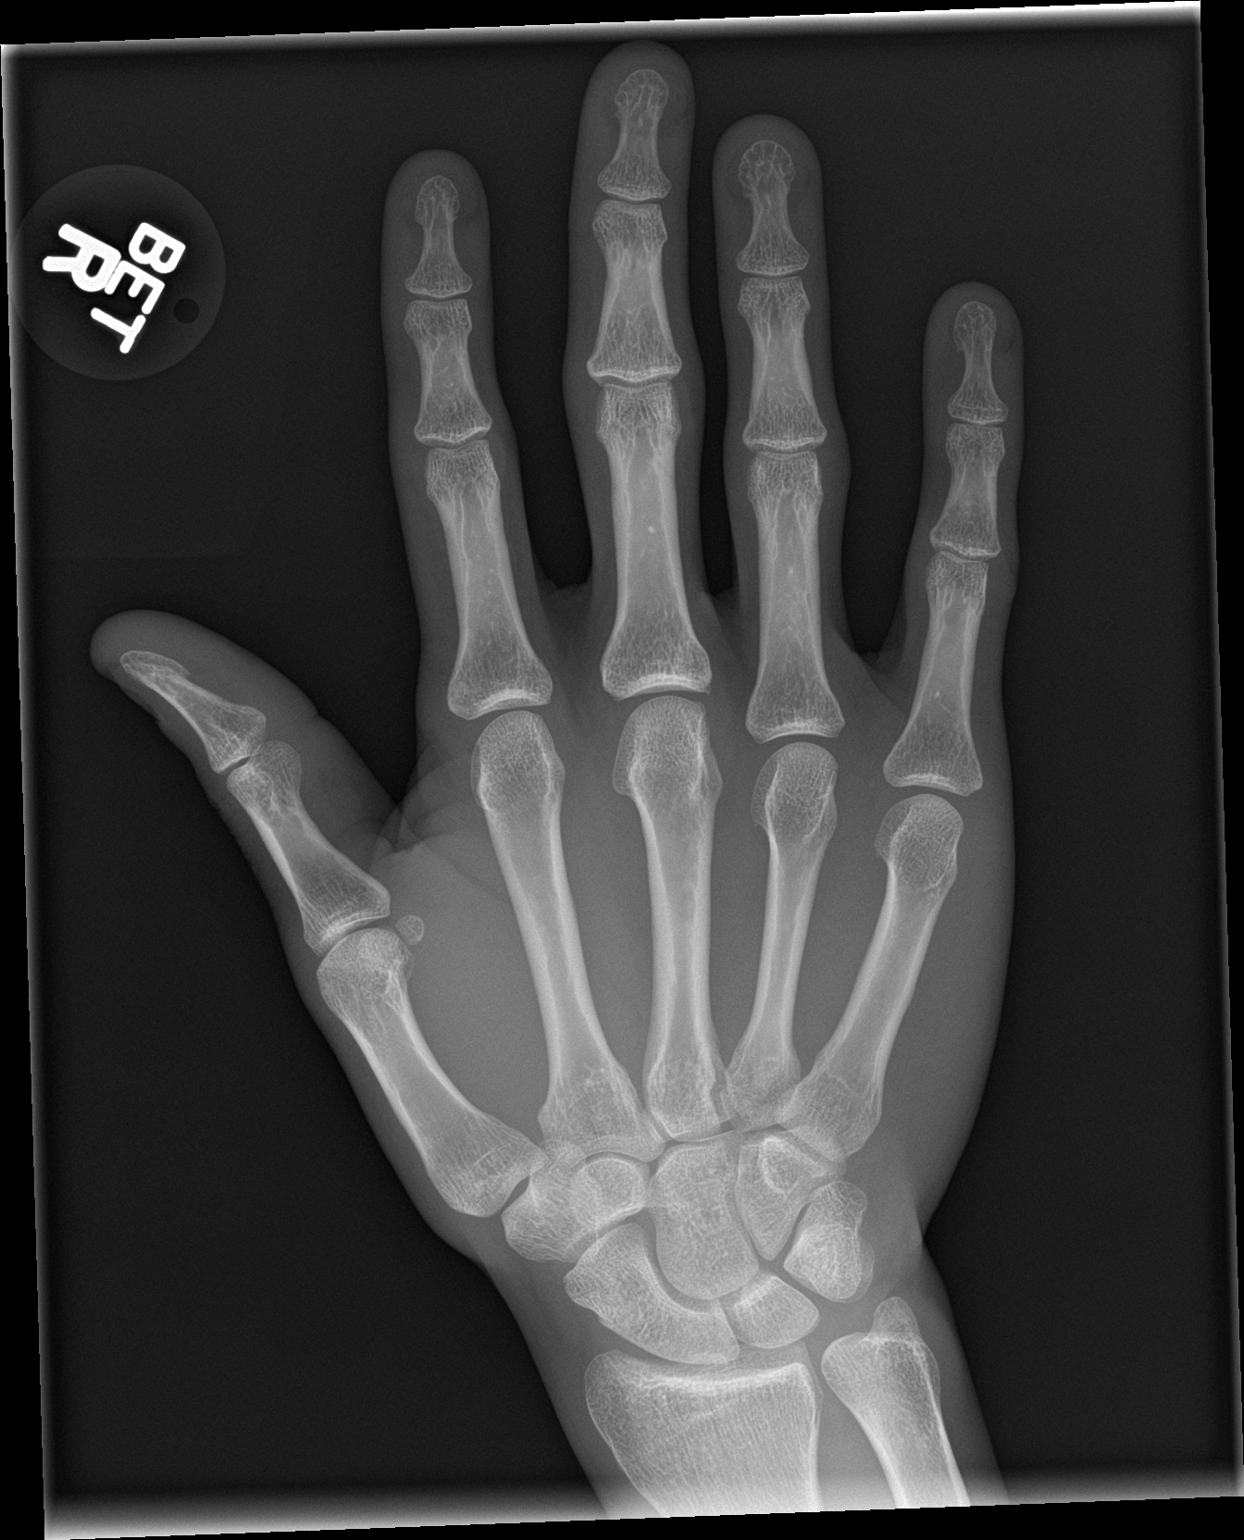

[hand obl]
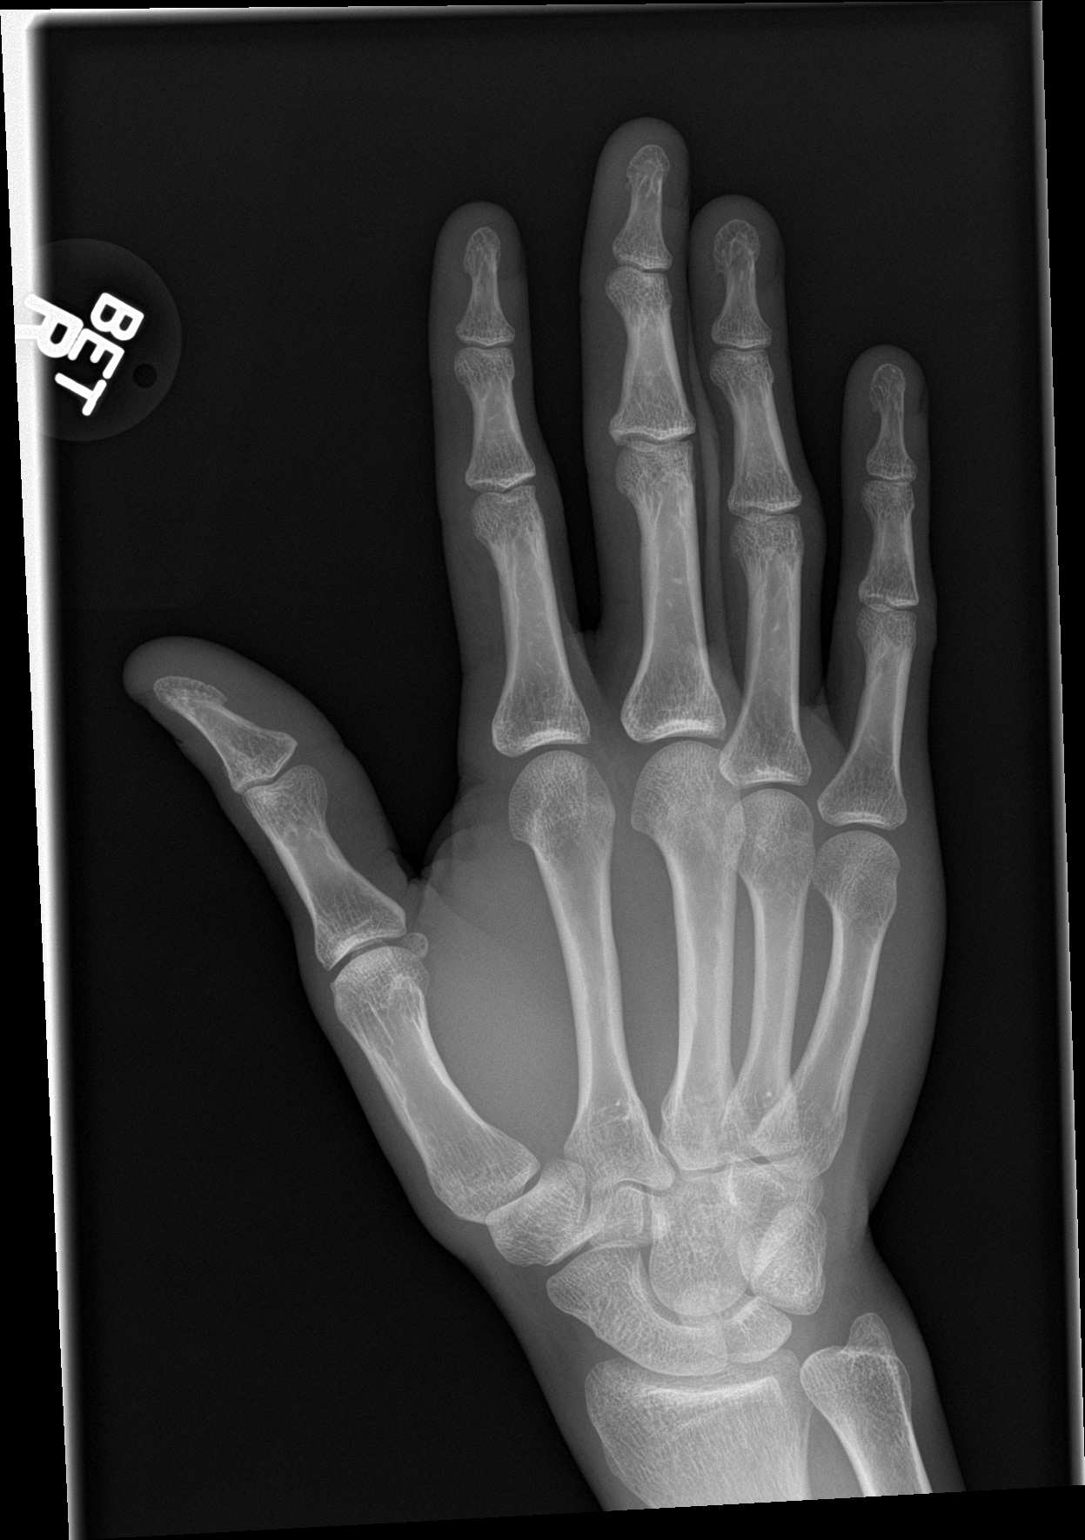

[hand lat]
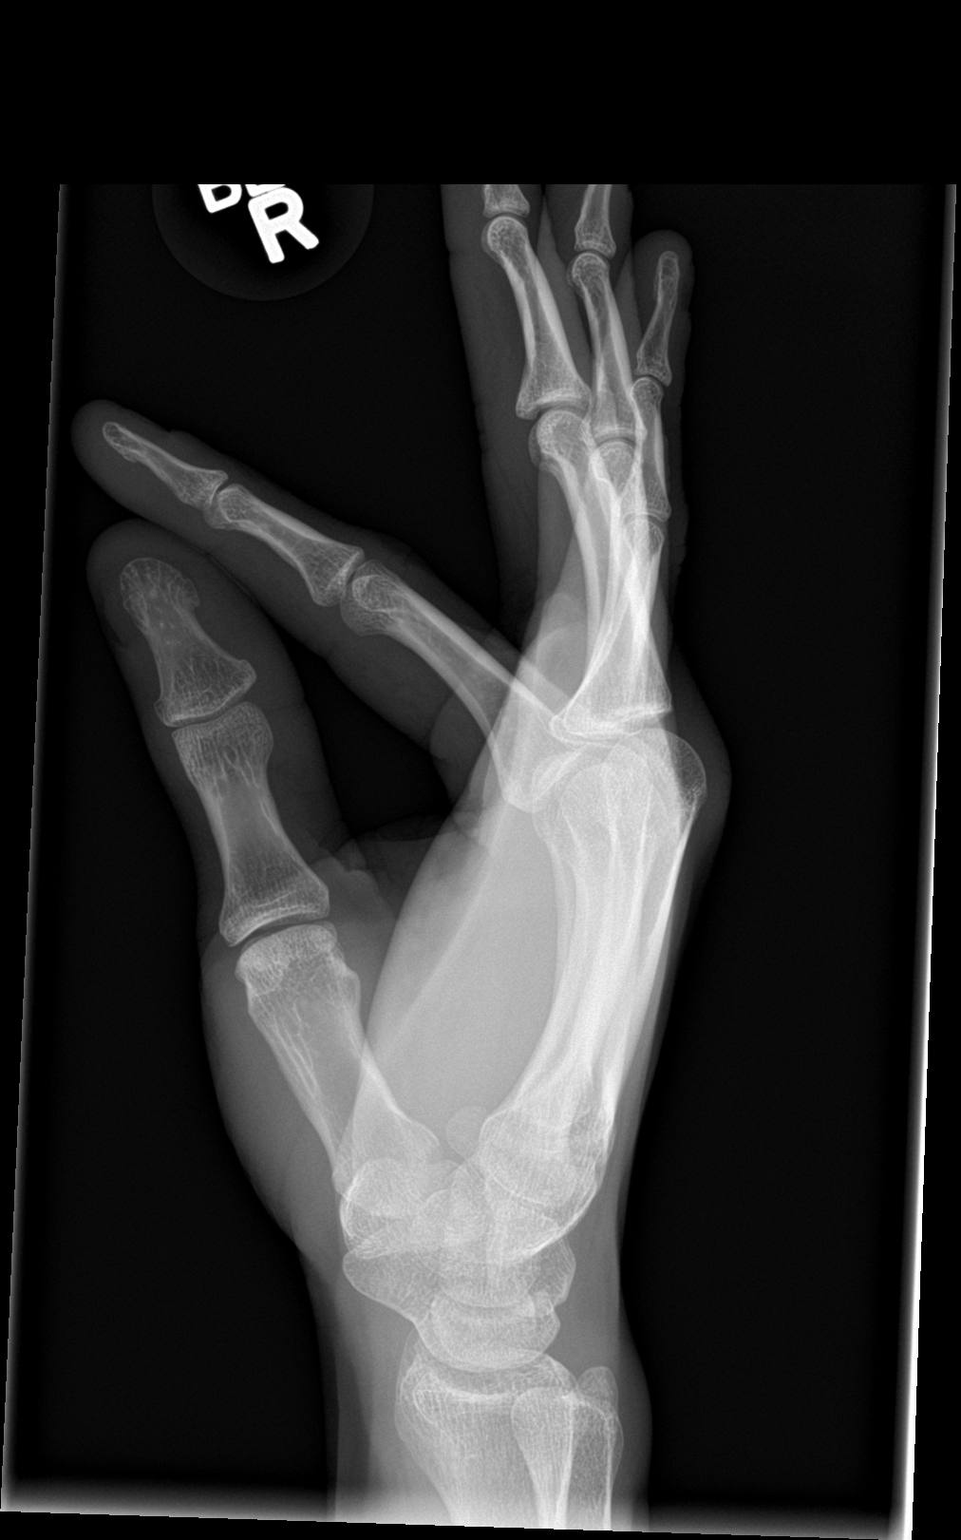

[3 of 3 positions shown; findings below may reference images not displayed]

FINDINGS: No evidence of acute fracture or dislocation. Joint spaces well
preserved. Well-preserved bone mineral density. No intrinsic osseous
abnormalities.
IMPRESSION: Normal examination.

## 2021-06-30 ENCOUNTER — Emergency Department (HOSPITAL_COMMUNITY)
Admission: EM | Admit: 2021-06-30 | Discharge: 2021-06-30 | Disposition: A | Discharge status: Home Health | Payer: Self-pay | Attending: Emergency Medicine | Admitting: Emergency Medicine

## 2021-06-30 ENCOUNTER — Encounter (HOSPITAL_COMMUNITY): Payer: Self-pay

## 2021-06-30 ENCOUNTER — Other Ambulatory Visit: Payer: Self-pay

## 2021-06-30 DIAGNOSIS — K0889 Other specified disorders of teeth and supporting structures: Secondary | ICD-10-CM | POA: Insufficient documentation

## 2021-06-30 MED ORDER — IBUPROFEN 800 MG PO TABS: 800.0000 mg | ORAL_TABLET | Freq: Three times a day (TID) | ORAL | 0 refills | Status: DC | PRN

## 2021-06-30 MED ORDER — PENICILLIN V POTASSIUM 500 MG PO TABS: 500.0000 mg | ORAL_TABLET | Freq: Four times a day (QID) | ORAL | 0 refills | Status: DC

## 2021-06-30 NOTE — ED Provider Notes (Signed)
?[facility] ?Provider Note ? ? ?CSN: [id number] ?Arrival date & time: 06/30/21  0707 ? ?  ? ?History ? ?Chief Complaint  ?Patient presents with  ? Dental Pain  ? ? ?Dylan Fowler is a 36 y.o. male. ? ?Patient with pain in his left lower tooth.  No past medical history ? ?The history is provided by the patient and medical records. No language interpreter was used.  ?Dental Pain ?Location:  Lower ?Quality:  Aching and dull ?Severity:  Moderate ?Onset quality:  Sudden ?Timing:  Constant ?Progression:  Worsening ?Chronicity:  New ?Context: not abscess   ?Associated symptoms: no congestion and no headaches   ? ?  ? ?Home Medications ?Prior to Admission medications   ?Medication Sig Start Date End Date Taking? Authorizing Provider  ?ibuprofen (ADVIL) 800 MG tablet Take 1 tablet (800 mg total) by mouth every 8 (eight) hours as needed. 06/30/21  Yes Bethann Berkshire, MD  ?penicillin v potassium (VEETID) 500 MG tablet Take 1 tablet (500 mg total) by mouth 4 (four) times daily. 06/30/21  Yes Bethann Berkshire, MD  ?amoxicillin (AMOXIL) 500 MG capsule Take 1 capsule (500 mg total) by mouth 3 (three) times daily. ?Patient not taking: Reported on 06/24/2015 05/02/15   Ivery Quale, PA-C  ?cyclobenzaprine (FLEXERIL) 10 MG tablet Take 1 tablet (10 mg total) by mouth 3 (three) times daily as needed. ?Patient not taking: Reported on 06/30/2021 08/27/15   Pauline Aus, PA-C  ?famotidine (PEPCID) 20 MG tablet Take 1 tablet (20 mg total) by mouth 2 (two) times daily. ?Patient not taking: Reported on 06/30/2021 06/24/15   Pauline Aus, PA-C  ?HYDROcodone-acetaminophen (NORCO/VICODIN) 5-325 MG tablet Take one tab po q 4-6 hrs prn pain ?Patient not taking: Reported on 06/30/2021 08/27/15   Pauline Aus, PA-C  ?naproxen (NAPROSYN) 500 MG tablet Take 1 tablet (500 mg total) by mouth 2 (two) times daily with a meal. ?Patient not taking: Reported on 06/30/2021 08/27/15   Triplett, Tammy, PA-C  ?ondansetron (ZOFRAN ODT) 8 MG  disintegrating tablet Take 1 tablet (8 mg total) by mouth every 8 (eight) hours as needed for nausea or vomiting. ?Patient not taking: Reported on 06/30/2021 02/09/17   Linwood Dibbles, MD  ?ondansetron (ZOFRAN) 4 MG tablet Take 1 tablet (4 mg total) by mouth every 6 (six) hours. As needed for vomiting ?Patient not taking: Reported on 06/30/2021 06/24/15   Pauline Aus, PA-C  ?traMADol (ULTRAM) 50 MG tablet Take 1 tablet (50 mg total) by mouth every 6 (six) hours as needed. ?Patient not taking: Reported on 06/24/2015 05/02/15   Ivery Quale, PA-C  ?   ? ?Allergies    ?Tomato   ? ?Review of Systems   ?Review of Systems  ?Constitutional:  Negative for appetite change and fatigue.  ?HENT:  Negative for congestion, ear discharge and sinus pressure.   ?     Tooth ache  ?Eyes:  Negative for discharge.  ?Respiratory:  Negative for cough.   ?Cardiovascular:  Negative for chest pain.  ?Gastrointestinal:  Negative for abdominal pain and diarrhea.  ?Genitourinary:  Negative for frequency and hematuria.  ?Musculoskeletal:  Negative for back pain.  ?Skin:  Negative for rash.  ?Neurological:  Negative for seizures and headaches.  ?Psychiatric/Behavioral:  Negative for hallucinations.   ? ?Physical Exam ?Updated Vital Signs ?BP (!) 146/94   Pulse 90   Temp 98.5 ?F (36.9 ?C) (Oral)   Resp 17   Ht 5\' 6"  (1.676 m)   Wt 59 kg   SpO2  100%   BMI 20.98 kg/m?  ?Physical Exam ?Vitals and nursing note reviewed.  ?Constitutional:   ?   Appearance: He is well-developed.  ?HENT:  ?   Head: Normocephalic.  ?   Comments: Left lower molar ?   Nose: Nose normal.  ?Eyes:  ?   General: No scleral icterus. ?   Conjunctiva/sclera: Conjunctivae normal.  ?Neck:  ?   Thyroid: No thyromegaly.  ?Cardiovascular:  ?   Rate and Rhythm: Normal rate and regular rhythm.  ?   Heart sounds: No murmur heard. ?  No friction rub. No gallop.  ?Pulmonary:  ?   Breath sounds: No stridor. No wheezing or rales.  ?Chest:  ?   Chest wall: No tenderness.  ?Abdominal:  ?    General: There is no distension.  ?   Tenderness: There is no abdominal tenderness. There is no rebound.  ?Musculoskeletal:     ?   General: Normal range of motion.  ?   Cervical back: Neck supple.  ?Lymphadenopathy:  ?   Cervical: No cervical adenopathy.  ?Skin: ?   Findings: No erythema or rash.  ?Neurological:  ?   Mental Status: He is alert and oriented to person, place, and time.  ?   Motor: No abnormal muscle tone.  ?   Coordination: Coordination normal.  ?Psychiatric:     ?   Behavior: Behavior normal.  ? ? ?ED Results / Procedures / Treatments   ?Labs ?(all labs ordered are listed, but only abnormal results are displayed) ?Labs Reviewed - No data to display ? ?EKG ?None ? ?Radiology ?No results found. ? ?Procedures ?Procedures  ? ? ?Medications Ordered in ED ?Medications - No data to display ? ?ED Course/ Medical Decision Making/ A&P ?  ?                        ?Medical Decision Making ?Risk ?Prescription drug management. ? ?This patient presents to the ED for concern of tooth ache, this involves an extensive number of treatment options, and is a complaint that carries with it a high risk of complications and morbidity.  The differential diagnosis includes abscessed tooth, oral cancer ? ? ?Co morbidities that complicate the patient evaluation ? ?None ? ? ?Additional history obtained: ? ?Additional history obtained from patient ?External records from outside source obtained and reviewed including hospital record ? ? ?Lab Tests: ?No lab ? ?Imaging Studies ordered: ?No imaging ?Cardiac Monitoring: / EKG: ? ?The patient was maintained on a cardiac monitor.  I personally viewed and interpreted the cardiac monitored which showed an underlying rhythm of: Normal sinus rhythm ? ? ?Consultations Obtained: ? ?No consult ? ?Problem List / ED Course / Critical interventions / Medication management ? ?Toothache ?I ordered medication including Motrin for pain ?Reevaluation of the patient after these medicines showed that  the patient improved ?I have reviewed the patients home medicines and have made adjustments as needed ? ? ?Social Determinants of Health: ? ?None ? ? ?Test / Admission - Considered: ? ?None ? ?Patient with dental pain.  He is put on penicillin and Motrin will follow-up with a dentist ? ? ? ? ? ? ? ?Final Clinical Impression(s) / ED Diagnoses ?Final diagnoses:  ?Pain, dental  ? ? ?Rx / DC Orders ?ED Discharge Orders   ? ?      Ordered  ?  ibuprofen (ADVIL) 800 MG tablet  Every 8 hours PRN       ?  06/30/21 1029  ?  penicillin v potassium (VEETID) 500 MG tablet  4 times daily       ? 06/30/21 1029  ? ?  ?  ? ?  ? ? ?  ?Bethann Berkshire, MD ?06/30/21 1808 ? ?

## 2021-06-30 NOTE — ED Triage Notes (Signed)
Patient complaining of left lower tooth pain with radiation to ear for the past week. Noted with mild swelling to face and neck area.  ?

## 2021-06-30 NOTE — Discharge Instructions (Addendum)
Follow-up with a dentist in the next week.  You have been referred to Dr.Locklear ?

## 2021-11-16 ENCOUNTER — Encounter (HOSPITAL_COMMUNITY): Payer: Self-pay

## 2021-11-16 ENCOUNTER — Other Ambulatory Visit: Payer: Self-pay

## 2021-11-16 ENCOUNTER — Emergency Department (HOSPITAL_COMMUNITY): Payer: 59

## 2021-11-16 ENCOUNTER — Emergency Department (HOSPITAL_COMMUNITY)
Admission: EM | Admit: 2021-11-16 | Discharge: 2021-11-16 | Disposition: A | Discharge status: Home / Self Care | Payer: 59 | Attending: Emergency Medicine | Admitting: Emergency Medicine

## 2021-11-16 DIAGNOSIS — Y9301 Activity, walking, marching and hiking: Secondary | ICD-10-CM | POA: Insufficient documentation

## 2021-11-16 DIAGNOSIS — S92535A Nondisplaced fracture of distal phalanx of left lesser toe(s), initial encounter for closed fracture: Secondary | ICD-10-CM | POA: Insufficient documentation

## 2021-11-16 DIAGNOSIS — W228XXA Striking against or struck by other objects, initial encounter: Secondary | ICD-10-CM | POA: Diagnosis not present

## 2021-11-16 DIAGNOSIS — S99922A Unspecified injury of left foot, initial encounter: Secondary | ICD-10-CM | POA: Diagnosis present

## 2021-11-16 MED ORDER — ACETAMINOPHEN 500 MG PO TABS
1000.0000 mg | ORAL_TABLET | Freq: Once | ORAL | Status: AC
  Administered 2021-11-16: 1000 mg via ORAL
  Filled 2021-11-16: qty 2

## 2021-11-16 NOTE — ED Provider Notes (Signed)
[facility] EMERGENCY DEPARTMENT Provider Note   CSN: [id number] Arrival date & time: 11/16/21  8413     History Chief Complaint  Patient presents with   Toe Injury    HPI Dylan Fowler is a 36 y.o. male presenting for left foot fifth phalangeal pain.  He states that he kicked a door that he was walking by yesterday and has severe pain.  He denies fevers or chills nausea vomiting syncope shortness of breath.  Otherwise ambulatory with a limp.  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   Patient's recorded medical, surgical, social, medication list and allergies were reviewed in the Snapshot window as part of the initial history.   Review of Systems   Review of Systems  Constitutional:  Negative for chills and fever.  HENT:  Negative for ear pain and sore throat.   Eyes:  Negative for pain and visual disturbance.  Respiratory:  Negative for cough and shortness of breath.   Cardiovascular:  Negative for chest pain and palpitations.  Gastrointestinal:  Negative for abdominal pain and vomiting.  Genitourinary:  Negative for dysuria and hematuria.  Musculoskeletal:  Negative for arthralgias and back pain.  Skin:  Negative for color change and rash.  Neurological:  Negative for seizures and syncope.  All other systems reviewed and are negative.   Physical Exam Updated Vital Signs BP (!) 137/94   Pulse 94   Temp 98.2 F (36.8 C) (Oral)   Resp 17   Ht 5\' 7"  (1.702 m)   Wt 61.7 kg   SpO2 96%   BMI 21.30 kg/m  Physical Exam Vitals and nursing note reviewed.  Constitutional:      General: He is not in acute distress.    Appearance: He is well-developed.  HENT:     Head: Normocephalic and atraumatic.  Eyes:     Conjunctiva/sclera: Conjunctivae normal.  Cardiovascular:     Rate and Rhythm: Normal rate and regular rhythm.     Heart sounds: No murmur heard. Pulmonary:     Effort: Pulmonary effort is normal. No  respiratory distress.     Breath sounds: Normal breath sounds.  Abdominal:     Palpations: Abdomen is soft.     Tenderness: There is no abdominal tenderness.  Musculoskeletal:        General: Signs of injury (Obvious deformity to fifth phalanx of left foot) present. No swelling.     Cervical back: Neck supple.  Skin:    General: Skin is warm and dry.     Capillary Refill: Capillary refill takes less than 2 seconds.  Neurological:     Mental Status: He is alert.  Psychiatric:        Mood and Affect: Mood normal.      ED Course/ Medical Decision Making/ A&P    Procedures Procedures   Medications Ordered in ED Medications  acetaminophen (TYLENOL) tablet 1,000 mg (1,000 mg Oral Given 11/16/21 0838)    Medical Decision Making:    Dylan Fowler is a 36 y.o. male who presented to the ED today with left foot 5th digit pain detailed above.     Patient placed on continuous vitals and telemetry monitoring while in ED which was reviewed periodically.   Complete initial physical exam performed, notably the patient  was hemodynamically stable in no acute distress.  Reviewed and confirmed nursing documentation for past medical history, family history, social history.    Initial Assessment:  This is most consistent with an acute complicated illness. He has deformity of the left greater toe.  No obvious injury of laceration or open fracture.  Likely tuft fracture.  Confirmed with x-ray.  I reviewed the images and agree with read of the tuft fracture. Patient stable for outpatient care management.  Supportive care reinforced patient recommended follow-up with orthopedic or podiatry if symptoms become uncontrolled.  Patient discharged in no acute events. Clinical Impression:  1. Closed nondisplaced fracture of distal phalanx of lesser toe of left foot, initial encounter      Data Unavailable   Final Clinical Impression(s) / ED Diagnoses Final diagnoses:  Closed nondisplaced fracture  of distal phalanx of lesser toe of left foot, initial encounter    Rx / DC Orders ED Discharge Orders     None         Glyn Ade, MD 11/16/21 [phone]

## 2021-11-16 NOTE — ED Triage Notes (Signed)
Patient states that he hit his 5th digit, left toe on door frame last night. States that he feels like he broke it and can hardly ambulate. Pain 10/10.

## 2022-04-12 ENCOUNTER — Emergency Department (HOSPITAL_COMMUNITY)
Admission: EM | Admit: 2022-04-12 | Discharge: 2022-04-12 | Disposition: A | Discharge status: Home / Self Care | Payer: Managed Care, Other (non HMO) | Attending: Emergency Medicine | Admitting: Emergency Medicine

## 2022-04-12 ENCOUNTER — Other Ambulatory Visit: Payer: Self-pay

## 2022-04-12 DIAGNOSIS — E871 Hypo-osmolality and hyponatremia: Secondary | ICD-10-CM | POA: Diagnosis not present

## 2022-04-12 DIAGNOSIS — R1013 Epigastric pain: Secondary | ICD-10-CM

## 2022-04-12 DIAGNOSIS — F17210 Nicotine dependence, cigarettes, uncomplicated: Secondary | ICD-10-CM | POA: Insufficient documentation

## 2022-04-12 DIAGNOSIS — R1010 Upper abdominal pain, unspecified: Secondary | ICD-10-CM | POA: Diagnosis present

## 2022-04-12 DIAGNOSIS — D72829 Elevated white blood cell count, unspecified: Secondary | ICD-10-CM | POA: Diagnosis not present

## 2022-04-12 DIAGNOSIS — R112 Nausea with vomiting, unspecified: Secondary | ICD-10-CM

## 2022-04-12 LAB — CBC WITH DIFFERENTIAL/PLATELET
Abs Immature Granulocytes: 0.05 10*3/uL (ref 0.00–0.07)
Basophils Absolute: 0.1 10*3/uL (ref 0.0–0.1)
Basophils Relative: 1 %
Eosinophils Absolute: 0 10*3/uL (ref 0.0–0.5)
Eosinophils Relative: 0 %
HCT: 43.3 % (ref 39.0–52.0)
Hemoglobin: 14.5 g/dL (ref 13.0–17.0)
Immature Granulocytes: 0 %
Lymphocytes Relative: 21 %
Lymphs Abs: 2.5 10*3/uL (ref 0.7–4.0)
MCH: 32.2 pg (ref 26.0–34.0)
MCHC: 33.5 g/dL (ref 30.0–36.0)
MCV: 96.2 fL (ref 80.0–100.0)
Monocytes Absolute: 1.2 10*3/uL — ABNORMAL HIGH (ref 0.1–1.0)
Monocytes Relative: 10 %
Neutro Abs: 8.2 10*3/uL — ABNORMAL HIGH (ref 1.7–7.7)
Neutrophils Relative %: 68 %
Platelets: 253 10*3/uL (ref 150–400)
RBC: 4.5 MIL/uL (ref 4.22–5.81)
RDW: 11.9 % (ref 11.5–15.5)
WBC: 12 10*3/uL — ABNORMAL HIGH (ref 4.0–10.5)
nRBC: 0 % (ref 0.0–0.2)

## 2022-04-12 LAB — COMPREHENSIVE METABOLIC PANEL
ALT: 33 U/L (ref 0–44)
AST: 71 U/L — ABNORMAL HIGH (ref 15–41)
Albumin: 3.9 g/dL (ref 3.5–5.0)
Alkaline Phosphatase: 66 U/L (ref 38–126)
Anion gap: 11 (ref 5–15)
BUN: 17 mg/dL (ref 6–20)
CO2: 21 mmol/L — ABNORMAL LOW (ref 22–32)
Calcium: 8.6 mg/dL — ABNORMAL LOW (ref 8.9–10.3)
Chloride: 98 mmol/L (ref 98–111)
Creatinine, Ser: 0.76 mg/dL (ref 0.61–1.24)
GFR, Estimated: >60 mL/min (ref >60)
Glucose, Bld: 163 mg/dL — ABNORMAL HIGH (ref 70–99)
Potassium: 4.4 mmol/L (ref 3.5–5.1)
Sodium: 130 mmol/L — ABNORMAL LOW (ref 135–145)
Total Bilirubin: 0.9 mg/dL (ref 0.3–1.2)
Total Protein: 7.1 g/dL (ref 6.5–8.1)

## 2022-04-12 LAB — URINALYSIS, ROUTINE W REFLEX MICROSCOPIC
Bacteria, UA: NONE SEEN
Bilirubin Urine: NEGATIVE
Glucose, UA: NEGATIVE mg/dL
Ketones, ur: 20 mg/dL — AB
Leukocytes,Ua: NEGATIVE
Nitrite: NEGATIVE
Protein, ur: NEGATIVE mg/dL
Specific Gravity, Urine: 1.012 (ref 1.005–1.030)
pH: 5 (ref 5.0–8.0)

## 2022-04-12 LAB — ETHANOL: Alcohol, Ethyl (B): <10 mg/dL (ref <10)

## 2022-04-12 LAB — LIPASE, BLOOD: Lipase: 23 U/L (ref 11–51)

## 2022-04-12 MED ORDER — SODIUM CHLORIDE 0.9 % IV BOLUS
1000.0000 mL | Freq: Once | INTRAVENOUS | Status: AC
  Administered 2022-04-12: 1000 mL via INTRAVENOUS

## 2022-04-12 MED ORDER — ONDANSETRON HCL 4 MG PO TABS: 4.0000 mg | ORAL_TABLET | Freq: Four times a day (QID) | ORAL | 0 refills | Status: DC

## 2022-04-12 MED ORDER — ONDANSETRON HCL 4 MG/2ML IJ SOLN
4.0000 mg | Freq: Once | INTRAMUSCULAR | Status: AC
  Administered 2022-04-12: 4 mg via INTRAVENOUS
  Filled 2022-04-12: qty 2

## 2022-04-12 MED ORDER — KETOROLAC TROMETHAMINE 30 MG/ML IJ SOLN
15.0000 mg | Freq: Once | INTRAMUSCULAR | Status: AC
  Administered 2022-04-12: 15 mg via INTRAVENOUS
  Filled 2022-04-12: qty 1

## 2022-04-12 NOTE — ED Notes (Signed)
AVS with prescriptions provided to and discussed with patient. Pt verbalizes understanding of discharge instructions and denies any questions or concerns at this time. Pt ambulated out of department independently with steady gait. ? ?

## 2022-04-12 NOTE — ED Notes (Signed)
Pt provided with water and saltine crackers for PO challenge.

## 2022-04-12 NOTE — ED Provider Notes (Signed)
[place] EMERGENCY DEPARTMENT AT [facility] Provider Note   CSN: [id number] Arrival date & time: 04/12/22  1246     History  Chief Complaint  Patient presents with   Abdominal Pain    Dylan Fowler is a 37 y.o. male.   Abdominal Pain   37 year old male presents emergency department with complaints of abdominal pain, nausea, vomiting.  Patient states that symptoms again after eating leftover [employer] chicken and mashed his last night.  States that he has had upper abdominal cramping type sensation with subsequent emesis nonbloody in appearance since onset.  Reports multiple episodes since symptoms began.  States no one ate the same thing as him and no one else is experiencing the same symptoms.  Reports subjective fever/chills as well as feelings of generalized weakness.  States that everything is tried to eat or drink since onset of symptoms has come back up."  Denies chest pain, shortness of breath, urinary symptoms, change in bowel habits.  Denies history of abdominal surgeries or history of similar symptoms in the past.  Patient also reports having 2 alcoholic beverages last night but states that this is usual for him after getting off work.  No significant pertinent past medical history.  Home Medications Prior to Admission medications   Medication Sig Start Date End Date Taking? Authorizing Provider  ondansetron (ZOFRAN) 4 MG tablet Take 1 tablet (4 mg total) by mouth every 6 (six) hours. 04/12/22  Yes Sherian Maroon A, PA      Allergies    Tomato    Review of Systems   Review of Systems  Gastrointestinal:  Positive for abdominal pain.  All other systems reviewed and are negative.   Physical Exam Updated Vital Signs BP 122/79   Pulse 83   Temp 98 F (36.7 C) (Oral)   Resp 18   Wt 56.7 kg   SpO2 100%   BMI 19.58 kg/m  Physical Exam Vitals and nursing note reviewed.  Constitutional:      General: He is not in acute distress.    Appearance: He is  well-developed.  HENT:     Head: Normocephalic and atraumatic.  Eyes:     Conjunctiva/sclera: Conjunctivae normal.  Cardiovascular:     Rate and Rhythm: Normal rate and regular rhythm.     Heart sounds: No murmur heard. Pulmonary:     Effort: Pulmonary effort is normal. No respiratory distress.     Breath sounds: Normal breath sounds.  Abdominal:     Palpations: Abdomen is soft.     Tenderness: There is abdominal tenderness in the epigastric area.     Comments: Mild epigastric tenderness.  Musculoskeletal:        General: No swelling.     Cervical back: Neck supple.  Skin:    General: Skin is warm and dry.     Capillary Refill: Capillary refill takes less than 2 seconds.  Neurological:     Mental Status: He is alert.  Psychiatric:        Mood and Affect: Mood normal.     ED Results / Procedures / Treatments   Labs (all labs ordered are listed, but only abnormal results are displayed) Labs Reviewed  CBC WITH DIFFERENTIAL/PLATELET - Abnormal; Notable for the following components:      Result Value   WBC 12.0 (*)    Neutro Abs 8.2 (*)    Monocytes Absolute 1.2 (*)    All other components within normal limits  URINALYSIS, ROUTINE W REFLEX  MICROSCOPIC - Abnormal; Notable for the following components:   Hgb urine dipstick SMALL (*)    Ketones, ur 20 (*)    All other components within normal limits  COMPREHENSIVE METABOLIC PANEL - Abnormal; Notable for the following components:   Sodium 130 (*)    CO2 21 (*)    Glucose, Bld 163 (*)    Calcium 8.6 (*)    AST 71 (*)    All other components within normal limits  ETHANOL  LIPASE, BLOOD    EKG None  Radiology No results found.  Procedures Procedures    Medications Ordered in ED Medications  ondansetron (ZOFRAN) injection 4 mg (4 mg Intravenous Given 04/12/22 1401)  sodium chloride 0.9 % bolus 1,000 mL (0 mLs Intravenous Stopped 04/12/22 1457)  ketorolac (TORADOL) 30 MG/ML injection 15 mg (15 mg Intravenous Given  04/12/22 1457)    ED Course/ Medical Decision Making/ A&P                             Medical Decision Making Amount and/or Complexity of Data Reviewed Labs: ordered.  Risk Prescription drug management.   This patient presents to the ED for concern of abdominal pain, this involves an extensive number of treatment options, and is a complaint that carries with it a high risk of complications and morbidity.  The differential diagnosis includes gastritis, PUD, CBD pathology, cholecystitis, pancreatitis, SBO/LBO, mesenteric ischemia, AAA, appendicitis, diverticulitis, cystitis, nephrolithiasis, pyelonephritis  Co morbidities that complicate the patient evaluation  See HPI   Additional history obtained:  Additional history obtained from EMR External records from outside source obtained and reviewed including hospital records   Lab Tests:  I Ordered, and personally interpreted labs.  The pertinent results include: Mild leukocytosis of 12.  No evidence anemia.  Platelets within range.  Hyponatremia with a sodium of 130 and decreased bicarb of 21 most likely secondary to GI loss is supplemented via IV fluids.  Mild elevation of AST without ALT elevation.  No renal dysfunction.  Lipase within normal limits.  UA concerning for 20 ketones and small hemoglobin but otherwise unremarkable.  Imaging Studies ordered:  Shared decision-making conversation was had with patient regarding withholding from imaging patient with reassessment after IV fluids and medicines.  Patient noted significant improvement of symptoms with medicines administered so imaging was not pursued at this time   Cardiac Monitoring: / EKG:  The patient was maintained on a cardiac monitor.  I personally viewed and interpreted the cardiac monitored which showed an underlying rhythm of: Sinus rhythm   Consultations Obtained:  N/a   Problem List / ED Course / Critical interventions / Medication management  Epigastric  abdominal pain/nausea/emesis I ordered medication including Toradol, Zofran, normal saline   Reevaluation of the patient after these medicines showed that the patient resolved I have reviewed the patients home medicines and have made adjustments as needed   Social Determinants of Health:  Chronic cigarette use.  Denies illicit drug use.   Test / Admission - Considered:  Epigastric abdominal pain/nausea/vomiting Vitals signs within normal range and stable throughout visit. Laboratory/imaging studies significant for: See above Patient with overall reassuring workup.  Patient's symptoms completely abated with administration of fluids and medications while in the emergency department.  Low suspicion for severe intra-abdominal pathology.  Could be secondary to food poisoning versus viral gastroenteritis.  Patient given antiemetics to take as needed outpatient as well as educated for rehydration via electrolyte rich  fluids and bland diet until more tolerable of complex diet.  Recommend follow-up with primary care for reassessment of symptoms.  Treatment plan discussed at length with patient and he acknowledged understanding was agreeable to said plan. Worrisome signs and symptoms were discussed with the patient, and the patient acknowledged understanding to return to the ED if noticed. Patient was stable upon discharge.          Final Clinical Impression(s) / ED Diagnoses Final diagnoses:  Epigastric pain  Nausea and vomiting, unspecified vomiting type    Rx / DC Orders ED Discharge Orders          Ordered    ondansetron (ZOFRAN) 4 MG tablet  Every 6 hours        04/12/22 1613              Peter Garter, [place] 04/12/22 1814    Bethann Berkshire, MD 04/13/22 [phone]

## 2022-04-12 NOTE — Discharge Instructions (Addendum)
The workup today was overall reassuring.  As discussed, take Zofran as needed for nausea/vomiting.  Make sure to hydrate with electrolyte rich fluids in the form of body armor, Pedialyte, sugar-free Gatorade.  Recommend bland diet for the next few days until your stomach adjusts to more regular foods.  Recommend reevaluation with primary care for reassessment of symptoms.  Please do not hesitate to return to emergency department the worrisome signs and symptoms we discussed become apparent.

## 2022-04-12 NOTE — ED Triage Notes (Signed)
Pt in with low abdominal cramping and emesis x 3 since last night. Pt states he ate [employer] last night and began vomiting around 10pm. Feels like he has chills as well. Temp 98.1 in triage. Denies any diarrhea

## 2022-05-06 ENCOUNTER — Emergency Department (HOSPITAL_COMMUNITY): Payer: Managed Care, Other (non HMO)

## 2022-05-06 ENCOUNTER — Emergency Department (HOSPITAL_COMMUNITY)
Admission: EM | Admit: 2022-05-06 | Discharge: 2022-05-07 | Disposition: A | Discharge status: Home / Self Care | Payer: Managed Care, Other (non HMO) | Attending: Emergency Medicine | Admitting: Emergency Medicine

## 2022-05-06 ENCOUNTER — Encounter (HOSPITAL_COMMUNITY): Payer: Self-pay

## 2022-05-06 ENCOUNTER — Emergency Department (HOSPITAL_COMMUNITY)
Admission: EM | Admit: 2022-05-06 | Discharge: 2022-05-06 | Disposition: A | Discharge status: Home / Self Care | Payer: Managed Care, Other (non HMO) | Attending: Emergency Medicine | Admitting: Emergency Medicine

## 2022-05-06 ENCOUNTER — Other Ambulatory Visit: Payer: Self-pay

## 2022-05-06 DIAGNOSIS — J029 Acute pharyngitis, unspecified: Secondary | ICD-10-CM | POA: Diagnosis not present

## 2022-05-06 DIAGNOSIS — R252 Cramp and spasm: Secondary | ICD-10-CM | POA: Diagnosis not present

## 2022-05-06 DIAGNOSIS — R1310 Dysphagia, unspecified: Secondary | ICD-10-CM | POA: Insufficient documentation

## 2022-05-06 DIAGNOSIS — K047 Periapical abscess without sinus: Secondary | ICD-10-CM | POA: Diagnosis not present

## 2022-05-06 DIAGNOSIS — H6121 Impacted cerumen, right ear: Secondary | ICD-10-CM | POA: Diagnosis not present

## 2022-05-06 DIAGNOSIS — R6884 Jaw pain: Secondary | ICD-10-CM

## 2022-05-06 LAB — CBC WITH DIFFERENTIAL/PLATELET
Abs Immature Granulocytes: 0.04 10*3/uL (ref 0.00–0.07)
Basophils Absolute: 0.1 10*3/uL (ref 0.0–0.1)
Basophils Relative: 1 %
Eosinophils Absolute: 0.1 10*3/uL (ref 0.0–0.5)
Eosinophils Relative: 1 %
HCT: 41.6 % (ref 39.0–52.0)
Hemoglobin: 14.5 g/dL (ref 13.0–17.0)
Immature Granulocytes: 0 %
Lymphocytes Relative: 45 %
Lymphs Abs: 5.3 10*3/uL — ABNORMAL HIGH (ref 0.7–4.0)
MCH: 33 pg (ref 26.0–34.0)
MCHC: 34.9 g/dL (ref 30.0–36.0)
MCV: 94.5 fL (ref 80.0–100.0)
Monocytes Absolute: 1.5 10*3/uL — ABNORMAL HIGH (ref 0.1–1.0)
Monocytes Relative: 12 %
Neutro Abs: 4.9 10*3/uL (ref 1.7–7.7)
Neutrophils Relative %: 41 %
Platelets: 320 10*3/uL (ref 150–400)
RBC: 4.4 MIL/uL (ref 4.22–5.81)
RDW: 11.7 % (ref 11.5–15.5)
WBC: 11.8 10*3/uL — ABNORMAL HIGH (ref 4.0–10.5)
nRBC: 0 % (ref 0.0–0.2)

## 2022-05-06 LAB — BASIC METABOLIC PANEL
Anion gap: 9 (ref 5–15)
BUN: 6 mg/dL (ref 6–20)
CO2: 23 mmol/L (ref 22–32)
Calcium: 8.5 mg/dL — ABNORMAL LOW (ref 8.9–10.3)
Chloride: 102 mmol/L (ref 98–111)
Creatinine, Ser: 0.64 mg/dL (ref 0.61–1.24)
GFR, Estimated: >60 mL/min (ref >60)
Glucose, Bld: 86 mg/dL (ref 70–99)
Potassium: 3.2 mmol/L — ABNORMAL LOW (ref 3.5–5.1)
Sodium: 134 mmol/L — ABNORMAL LOW (ref 135–145)

## 2022-05-06 MED ORDER — POTASSIUM CHLORIDE 10 MEQ/100ML IV SOLN
10.0000 meq | INTRAVENOUS | Status: AC
  Administered 2022-05-07: 10 meq via INTRAVENOUS
  Filled 2022-05-06: qty 100

## 2022-05-06 MED ORDER — MORPHINE SULFATE (PF) 4 MG/ML IV SOLN
4.0000 mg | Freq: Once | INTRAVENOUS | Status: AC
  Administered 2022-05-06: 4 mg via INTRAVENOUS
  Filled 2022-05-06: qty 1

## 2022-05-06 MED ORDER — DEXAMETHASONE SODIUM PHOSPHATE 10 MG/ML IJ SOLN
10.0000 mg | Freq: Once | INTRAMUSCULAR | Status: AC
  Administered 2022-05-06: 10 mg via INTRAVENOUS
  Filled 2022-05-06: qty 1

## 2022-05-06 MED ORDER — SODIUM CHLORIDE 0.9 % IV SOLN
3.0000 g | Freq: Once | INTRAVENOUS | Status: AC
  Administered 2022-05-06: 3 g via INTRAVENOUS
  Filled 2022-05-06: qty 8

## 2022-05-06 MED ORDER — SODIUM CHLORIDE 0.9 % IV SOLN
3.0000 g | Freq: Three times a day (TID) | INTRAVENOUS | Status: DC
  Administered 2022-05-06 – 2022-05-07 (×3): 3 g via INTRAVENOUS
  Filled 2022-05-06 (×3): qty 8

## 2022-05-06 MED ORDER — LACTATED RINGERS IV SOLN: INTRAVENOUS | Status: DC

## 2022-05-06 MED ORDER — ONDANSETRON HCL 4 MG/2ML IJ SOLN
4.0000 mg | Freq: Once | INTRAMUSCULAR | Status: AC
  Administered 2022-05-06: 4 mg via INTRAVENOUS
  Filled 2022-05-06: qty 2

## 2022-05-06 MED ORDER — IOHEXOL 300 MG/ML  SOLN
75.0000 mL | Freq: Once | INTRAMUSCULAR | Status: AC | PRN
  Administered 2022-05-06: 75 mL via INTRAVENOUS

## 2022-05-06 MED ORDER — KETOROLAC TROMETHAMINE 30 MG/ML IJ SOLN: 30.0000 mg | Freq: Four times a day (QID) | INTRAMUSCULAR | Status: DC | PRN

## 2022-05-06 MED ORDER — SODIUM CHLORIDE 0.9 % IV SOLN
3.0000 g | Freq: Once | INTRAVENOUS | Status: DC
  Filled 2022-05-06: qty 8

## 2022-05-06 NOTE — Consult Note (Signed)
Initial Consultation Note   Patient: Dylan Fowler [id number] DOB: 1985/07/26 PCP: Pcp, No DOA: 05/06/2022 DOS: the patient was seen and examined on 05/06/2022 Primary service: Default, Provider, MD  Referring physician: Nanda Quinton, MD (Emergency Medicine) Reason for consult: Left jaw pain and swelling  Assessment/Plan: Odontogenic abscess: CT shows 1.3 x 0.8 x 1.0 cm odontogenic abscess along the left posterior mandibular body with associated soft tissue swelling and inflammatory stranding within the adjacent left parapharyngeal and submandibular spaces.  Presenting with trismus and unable to fully open mouth.  He is however managing oral secretions, protecting airway, and speaking in a clear voice.  ENT here will not manage and we do not have inpatient dental or oral surgery services available tonight or through the weekend therefore plan is to transfer to [place] with IV Unasyn -Received IV Decadron 10 mg -Will order IV Toradol prn -Accepted to [facility], awaiting transfer  Hypokalemia: Mild, will order IV supplement.  Tobacco use: Reports smoking half pack per day.  He declines nicotine patch.  TRH will continue to follow the patient.  HPI:  Dylan Fowler is a 37 y.o. male with no significant medical history who presented to the ED for evaluation of left jaw pain and swelling.  Patient reports 1 week ago having left ear pain.  He then noticed pain at his left jaw with associated swelling.  Pain and swelling has worsened over the last few days and now he has had difficulty opening his mouth.  He has had some pain swallowing solid foods but has been able to tolerate liquids.  He does note tenderness along his left anterior cervical area as well.  Initial vitals showed BP 138/95, pulse 92, RR 18, temp 98.3 F, SpO2 100% on room air.  Labs show WBC 11.8, hemoglobin 14.5, platelets 320,000, sodium 134, potassium 3.2, bicarb 23, BUN 6, creatinine 0.64, serum  glucose 86.  CT soft tissue neck with contrast showed poor dentition with associated 1.3 x 0.8 x 1.0 cm odontogenic abscess along the lingual aspect of the posterior mandibular body with associated soft tissue swelling and inflammatory stranding within the adjacent left parapharyngeal and submandibular spaces consistent with regional infection/cellulitis.  Upper left cervical adenopathy, presumably reactive noted.  Prominent dental caries and periapical lucency of the left third mandibular molar noted.  Patient was started on IV Unasyn, given IV Decadron 10 mg, IV morphine 4 mg.  EDP spoke with on-call ENT, Dr. Benjamine Mola.  Due to odontogenic source oral surgery consultation was recommended.  There is no inpatient dental or oral surgery service available in the [facility] health system tonight or over the weekend therefore EP, Dr. Laverta Baltimore, discussed case with Dr. Margorie John with ENT at [facility] who agreed with transfer and admission to their service.  The hospitalist service was consulted to assist with medical management while awaiting transfer.  Review of Systems: As mentioned in the history of present illness. All other systems reviewed and are negative. History reviewed. No pertinent past medical history. History reviewed. No pertinent surgical history. Social History:  reports that he has been smoking cigarettes. He has been smoking an average of .5 packs per day. He has never used smokeless tobacco. He reports current alcohol use. He reports that he does not use drugs.  Allergies  Allergen Reactions   Tomato Hives    History reviewed. No pertinent family history.  Prior to Admission medications   Medication Sig Start Date End Date Taking? Authorizing Provider  acetaminophen (TYLENOL) 500 MG  tablet Take 500 mg by mouth every 6 (six) hours as needed for moderate pain.    [provider]    Physical Exam: Vitals:   05/06/22 2000 05/06/22 2024 05/06/22 2112 05/06/22 2230  BP: 127/88  (!) 129/92 130/87   Pulse: 88  (!) 59 86  Resp: 18   18  Temp:  97.9 F (36.6 C)    TempSrc:  Oral    SpO2: 99%  100% 99%  Weight:      Height:       General: resting in bed HEENT: EOMI, no scleral icterus.  Oropharyngeal exam limited due to inability to fully open mouth.  Protecting oral secretions and speaking in clear sentences.  Poor dentition left teeth.  Left anterior cervical tenderness to palpation. Cardiac: RRR, no rubs, murmurs or gallops Pulm: clear to auscultation bilaterally, moving normal volumes of air Abd: soft, nontender, nondistended, BS present Ext: warm and well perfused, no pedal edema Neuro: alert and oriented X3  Data Reviewed:   There are no new results to review at this time.    Family Communication: Discussed with patient, he has discussed with family Primary team communication: Discussed with Dr. Laverta Baltimore Thank you very much for involving [place] in the care of your patient.  Author: Zada Finders, MD 05/06/2022 11:06 PM  For on call review [email or web address].

## 2022-05-06 NOTE — ED Provider Notes (Signed)
[name] Note   CSN: [id number] Arrival date & time: 05/06/22  1156     History  Chief Complaint  Patient presents with  . Jaw Pain    Dylan Fowler is a 37 y.o. male.  With no significant past medical history who presents to the emergency department with jaw pain.  States about 1 week ago he began having left ear pain. He states that the pain then spread to his left jaw and he has had difficulty opening his mouth. He states the pain is severe and he has started noticing swelling in his left neck over the past few days. He has been able to swallow soup at home but unable to tolerate solid foods. He denies known fevers.   HPI     Home Medications Prior to Admission medications   Medication Sig Start Date End Date Taking? Authorizing Provider  acetaminophen (TYLENOL) 500 MG tablet Take 500 mg by mouth every 6 (six) hours as needed for moderate pain.   Yes [provider]      Allergies    Tomato    Review of Systems   Review of Systems  HENT:  Positive for ear pain, sore throat and trouble swallowing.   All other systems reviewed and are negative.   Physical Exam Updated Vital Signs BP (!) 138/95 (BP Location: Right Arm)   Pulse 92   Temp 98.3 F (36.8 C) (Oral)   Resp 18   Ht '5\' 6"'$  (1.676 m)   Wt 59 kg   SpO2 100%   BMI 20.98 kg/m  Physical Exam Vitals and nursing note reviewed.  Constitutional:      General: He is not in acute distress.    Appearance: Normal appearance. He is ill-appearing. He is not toxic-appearing.  HENT:     Head: Normocephalic.     Jaw: There is normal jaw occlusion. Trismus and tenderness present.     Right Ear: There is impacted cerumen.     Left Ear: A middle ear effusion is present. No mastoid tenderness.     Nose: Nose normal.     Mouth/Throat:     Mouth: Mucous membranes are moist.     Comments: Trismus on exam. Able to get about 1 finger width between his  teeth. I am unable to visualize his posterior oropharynx.  There is swelling to the left neck ?adenopathy vs deep space infection. From what I can visualize he has poor dentition but I do not appreciate any obvious dental abscess in the left mouth.  Eyes:     General: No scleral icterus.    Extraocular Movements: Extraocular movements intact.     Pupils: Pupils are equal, round, and reactive to light.  Cardiovascular:     Rate and Rhythm: Normal rate and regular rhythm.     Pulses: Normal pulses.  Pulmonary:     Effort: Pulmonary effort is normal. No respiratory distress.     Breath sounds: Normal breath sounds.  Abdominal:     General: Bowel sounds are normal.     Palpations: Abdomen is soft.  Skin:    General: Skin is warm and dry.     Capillary Refill: Capillary refill takes less than 2 seconds.  Neurological:     General: No focal deficit present.     Mental Status: He is alert and oriented to person, place, and time. Mental status is at baseline.  Psychiatric:  Mood and Affect: Mood normal.        Behavior: Behavior normal.        Thought Content: Thought content normal.        Judgment: Judgment normal.     ED Results / Procedures / Treatments   Labs (all labs ordered are listed, but only abnormal results are displayed) Labs Reviewed  BASIC METABOLIC PANEL  CBC WITH DIFFERENTIAL/PLATELET    EKG None  Radiology No results found.  Procedures Procedures   Medications Ordered in ED Medications  Ampicillin-Sulbactam (UNASYN) 3 g in sodium chloride 0.9 % 100 mL IVPB (has no administration in time range)    ED Course/ Medical Decision Making/ A&P Clinical Course as of 05/06/22 1447  Thu May 06, 2022  1446 Patient mother arrived and prefers to take him rather than waiting on [facility]. This is appropriate. Patient condition stable. He will go via POV to [facility] at this time. Dr. Mayra Neer at [facility] made aware of transport change and EMTALA completed [LA]    Clinical  Course User Index [LA] Mickie Hillier, PA-C   { Medical Decision Making Initial Impression and Ddx 37 year old male who presents to the emergency department with jaw pain Patient PMH that increases complexity of ED encounter: None Differential: ACS, TMJ, trigeminal neuralgia, oropharyngeal infection or deep space infection, etc.  Interpretation of Diagnostics I independent reviewed and interpreted the labs as followed: Basic labs ordered  - I independently visualized the following imaging with scope of interpretation limited to determining acute life threatening conditions related to emergency care: CT soft tissue neck ordered  Patient Reassessment and Ultimate Disposition/Management 37 year old male who presents to the emergency department with jaw pain and neck swelling.  He has trismus on exam and swelling of the left neck.  Does appear to have a mild left TM effusion as well.  I am unable to completely visualize the posterior oropharynx to see if there is an obvious PTA.  He has no stridor.  He is oxygenating 100% on room air.  There is no respiratory distress on my exam.  He was also evaluated by Dr. Roderic Palau, ED attending.  He will need transfer over to [facility] emergency department to have ongoing workup including CT soft tissue of his neck.  Unfortunately, at [facility] emergency department we do not have access to CT scanner at this time.  I discussed this with the patient and he is agreeable to transport over to [facility] for ongoing care.  He is unable to transport himself due to transportation issues so he will be transferred via [facility].  Spoke with Dr. Mayra Neer, ED attending at [facility] long ED who accepts the patient.  Will go ahead and start IV antibiotics while he is waiting for [facility].  Patient management required discussion with the following services or consulting groups:  None  Complexity of Problems Addressed Acute complicated illness or Injury  Additional Data  Reviewed and Analyzed Further history obtained from: Past medical history and medications listed in the EMR  Patient Encounter Risk Assessment SDOH impact on management and Consideration of hospitalization  Final Clinical Impression(s) / ED Diagnoses Final diagnoses:  Jaw pain    Rx / DC Orders ED Discharge Orders     None         Mickie Hillier, PA-C 05/06/22 1405    Milton Ferguson, MD 05/08/22 [phone]

## 2022-05-06 NOTE — ED Provider Notes (Signed)
There were no vitals taken for this visit.   In short, Dylan Fowler is a 37 y.o. male with a chief complaint of Jaw Pain .  Refer to the original H&P for additional details.  04:24 PM  Patient arrives to the ED via [facility] for CT. No CT working at [facility] today. Trismus noted on exam with left dental pain and swelling just inferior to the left mandible. No anterior oropharynx swelling but unable to visualize the posterior pharynx. Speaking in a clear voice. Managing oral secretions. Mild leukocytosis to 11.8. Creatinine normal at 6.4. Abx ordered but not given prior to leaving [facility]. Have ordered Unasyn here.   08:28 PM  Spoke with Dr. Benjamine Mola with ENT. Because this is odontogenic, he would recommend oral surgery consultation. We do not have this service available at this hour and so recommends discussion with ENT. Discussed case with Dr. Margorie John with ENT at [facility]. Agrees with plan for Unasyn and Decadron. Plan for admit to their service. Awaiting bed at this time.   Spoke with Dr. Posey Pronto. [facility] can consult and follow while awaiting transfer to [place], [name], MD 05/06/22 2221

## 2022-05-06 NOTE — ED Triage Notes (Signed)
Pt sent from [facility] for CT eval of jaw infection/pain

## 2022-05-06 NOTE — ED Triage Notes (Signed)
Pt complaining of left sided jaw pain that started last week. 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 But the pain has settled in the jaw and he can barely open his mouth.

## 2022-05-06 NOTE — ED Notes (Signed)
Per Dr. Laverta Baltimore pt is on wait list at [facility]. Dr. Laverta Baltimore is consulting with hospitalist at  [facility] while pt is waiting for transfer. Pt updated.

## 2022-05-07 LAB — CBC
HCT: 44.3 % (ref 39.0–52.0)
Hemoglobin: 14.7 g/dL (ref 13.0–17.0)
MCH: 31.7 pg (ref 26.0–34.0)
MCHC: 33.2 g/dL (ref 30.0–36.0)
MCV: 95.5 fL (ref 80.0–100.0)
Platelets: 332 10*3/uL (ref 150–400)
RBC: 4.64 MIL/uL (ref 4.22–5.81)
RDW: 11.7 % (ref 11.5–15.5)
WBC: 10.4 10*3/uL (ref 4.0–10.5)
nRBC: 0 % (ref 0.0–0.2)

## 2022-05-07 LAB — BASIC METABOLIC PANEL
Anion gap: 8 (ref 5–15)
BUN: 8 mg/dL (ref 6–20)
CO2: 22 mmol/L (ref 22–32)
Calcium: 8.4 mg/dL — ABNORMAL LOW (ref 8.9–10.3)
Chloride: 104 mmol/L (ref 98–111)
Creatinine, Ser: 0.66 mg/dL (ref 0.61–1.24)
GFR, Estimated: >60 mL/min (ref >60)
Glucose, Bld: 113 mg/dL — ABNORMAL HIGH (ref 70–99)
Potassium: 4.1 mmol/L (ref 3.5–5.1)
Sodium: 134 mmol/L — ABNORMAL LOW (ref 135–145)

## 2022-05-07 MED ORDER — NICOTINE 14 MG/24HR TD PT24
14.0000 mg | MEDICATED_PATCH | Freq: Once | TRANSDERMAL | Status: DC
  Administered 2022-05-07: 14 mg via TRANSDERMAL
  Filled 2022-05-07: qty 1

## 2022-05-07 MED ORDER — POTASSIUM CHLORIDE 10 MEQ/100ML IV SOLN
10.0000 meq | INTRAVENOUS | Status: AC
  Administered 2022-05-07 (×2): 10 meq via INTRAVENOUS
  Filled 2022-05-07 (×2): qty 100

## 2022-05-07 NOTE — ED Notes (Addendum)
Report given to [facility], RN. [phone] at [facility]. Report given to [facility], [employer] for transportation.

## 2022-05-07 NOTE — ED Notes (Signed)
Patient is still awaiting a bed at [facility]. At this time transport is not ordered because he has no bed. Dr. Margorie John is the accepting doctor. Patient has a consult for ENT ( jaw pain).

## 2022-07-05 ENCOUNTER — Emergency Department (HOSPITAL_COMMUNITY)
Admission: EM | Admit: 2022-07-05 | Discharge: 2022-07-05 | Disposition: A | Discharge status: Home / Self Care | Payer: Managed Care, Other (non HMO) | Attending: Emergency Medicine | Admitting: Emergency Medicine

## 2022-07-05 ENCOUNTER — Emergency Department (HOSPITAL_COMMUNITY): Payer: Managed Care, Other (non HMO)

## 2022-07-05 ENCOUNTER — Encounter (HOSPITAL_COMMUNITY): Payer: Self-pay | Admitting: *Deleted

## 2022-07-05 ENCOUNTER — Other Ambulatory Visit: Payer: Self-pay

## 2022-07-05 DIAGNOSIS — R220 Localized swelling, mass and lump, head: Secondary | ICD-10-CM | POA: Diagnosis present

## 2022-07-05 DIAGNOSIS — K047 Periapical abscess without sinus: Secondary | ICD-10-CM | POA: Diagnosis not present

## 2022-07-05 LAB — CBC WITH DIFFERENTIAL/PLATELET
Abs Immature Granulocytes: 0.04 10*3/uL (ref 0.00–0.07)
Basophils Absolute: 0.1 10*3/uL (ref 0.0–0.1)
Basophils Relative: 1 %
Eosinophils Absolute: 0 10*3/uL (ref 0.0–0.5)
Eosinophils Relative: 0 %
HCT: 45.2 % (ref 39.0–52.0)
Hemoglobin: 15 g/dL (ref 13.0–17.0)
Immature Granulocytes: 0 %
Lymphocytes Relative: 17 %
Lymphs Abs: 1.9 10*3/uL (ref 0.7–4.0)
MCH: 31.4 pg (ref 26.0–34.0)
MCHC: 33.2 g/dL (ref 30.0–36.0)
MCV: 94.8 fL (ref 80.0–100.0)
Monocytes Absolute: 1.6 10*3/uL — ABNORMAL HIGH (ref 0.1–1.0)
Monocytes Relative: 14 %
Neutro Abs: 7.6 10*3/uL (ref 1.7–7.7)
Neutrophils Relative %: 68 %
Platelets: 269 10*3/uL (ref 150–400)
RBC: 4.77 MIL/uL (ref 4.22–5.81)
RDW: 12 % (ref 11.5–15.5)
WBC: 11.3 10*3/uL — ABNORMAL HIGH (ref 4.0–10.5)
nRBC: 0 % (ref 0.0–0.2)

## 2022-07-05 LAB — I-STAT CHEM 8, ED
BUN: 6 mg/dL (ref 6–20)
Calcium, Ion: 1.15 mmol/L (ref 1.15–1.40)
Chloride: 99 mmol/L (ref 98–111)
Creatinine, Ser: 0.6 mg/dL — ABNORMAL LOW (ref 0.61–1.24)
Glucose, Bld: 71 mg/dL (ref 70–99)
HCT: 52 % (ref 39.0–52.0)
Hemoglobin: 17.7 g/dL — ABNORMAL HIGH (ref 13.0–17.0)
Potassium: 4.3 mmol/L (ref 3.5–5.1)
Sodium: 132 mmol/L — ABNORMAL LOW (ref 135–145)
TCO2: 21 mmol/L — ABNORMAL LOW (ref 22–32)

## 2022-07-05 LAB — COMPREHENSIVE METABOLIC PANEL
ALT: 30 U/L (ref 0–44)
AST: 49 U/L — ABNORMAL HIGH (ref 15–41)
Albumin: 4.2 g/dL (ref 3.5–5.0)
Alkaline Phosphatase: 93 U/L (ref 38–126)
Anion gap: 15 (ref 5–15)
BUN: 8 mg/dL (ref 6–20)
CO2: 19 mmol/L — ABNORMAL LOW (ref 22–32)
Calcium: 9 mg/dL (ref 8.9–10.3)
Chloride: 96 mmol/L — ABNORMAL LOW (ref 98–111)
Creatinine, Ser: 0.72 mg/dL (ref 0.61–1.24)
GFR, Estimated: >60 mL/min (ref >60)
Glucose, Bld: 75 mg/dL (ref 70–99)
Potassium: 4.3 mmol/L (ref 3.5–5.1)
Sodium: 130 mmol/L — ABNORMAL LOW (ref 135–145)
Total Bilirubin: 2.3 mg/dL — ABNORMAL HIGH (ref 0.3–1.2)
Total Protein: 8 g/dL (ref 6.5–8.1)

## 2022-07-05 LAB — LACTIC ACID, PLASMA
Lactic Acid, Venous: 1.3 mmol/L (ref 0.5–1.9)
Lactic Acid, Venous: 1.2 mmol/L (ref 0.5–1.9)

## 2022-07-05 MED ORDER — DEXAMETHASONE SODIUM PHOSPHATE 10 MG/ML IJ SOLN
10.0000 mg | Freq: Once | INTRAMUSCULAR | Status: AC
  Administered 2022-07-05: 10 mg via INTRAVENOUS
  Filled 2022-07-05: qty 1

## 2022-07-05 MED ORDER — AMOXICILLIN-POT CLAVULANATE 875-125 MG PO TABS: 1.0000 | ORAL_TABLET | Freq: Two times a day (BID) | ORAL | 0 refills | Status: DC

## 2022-07-05 MED ORDER — IOHEXOL 300 MG/ML  SOLN
75.0000 mL | Freq: Once | INTRAMUSCULAR | Status: AC | PRN
  Administered 2022-07-05: 75 mL via INTRAVENOUS

## 2022-07-05 MED ORDER — SODIUM CHLORIDE 0.9 % IV SOLN
3.0000 g | Freq: Once | INTRAVENOUS | Status: AC
  Administered 2022-07-05: 3 g via INTRAVENOUS
  Filled 2022-07-05: qty 8

## 2022-07-05 MED ORDER — KETOROLAC TROMETHAMINE 15 MG/ML IJ SOLN
15.0000 mg | Freq: Once | INTRAMUSCULAR | Status: AC
  Administered 2022-07-05: 15 mg via INTRAVENOUS
  Filled 2022-07-05: qty 1

## 2022-07-05 MED ORDER — FENTANYL CITRATE PF 50 MCG/ML IJ SOSY
50.0000 ug | PREFILLED_SYRINGE | Freq: Once | INTRAMUSCULAR | Status: AC
  Administered 2022-07-05: 50 ug via INTRAVENOUS
  Filled 2022-07-05: qty 1

## 2022-07-05 MED ORDER — LACTATED RINGERS IV BOLUS (SEPSIS)
1000.0000 mL | Freq: Once | INTRAVENOUS | Status: AC
  Administered 2022-07-05: 1000 mL via INTRAVENOUS

## 2022-07-05 MED ORDER — OXYCODONE-ACETAMINOPHEN 5-325 MG PO TABS
1.0000 | ORAL_TABLET | Freq: Once | ORAL | Status: AC
  Administered 2022-07-05: 1 via ORAL
  Filled 2022-07-05: qty 1

## 2022-07-05 MED ORDER — PREDNISONE 10 MG PO TABS: 40.0000 mg | ORAL_TABLET | Freq: Every day | ORAL | 0 refills | Status: AC

## 2022-07-05 NOTE — Discharge Instructions (Addendum)
You will need to call the number below soon as possible to set up a dentist appointment.  The dentist is confident that you can get seen today or tomorrow.  You will need to likely have that tooth removed.  There are prescriptions that were sent to your pharmacy for continued antibiotics.  Discuss further when you see the dentist.  Return the emergency department for any worsening symptoms of concern.

## 2022-07-05 NOTE — ED Provider Notes (Signed)
[place] EMERGENCY DEPARTMENT AT [facility] Provider Note   CSN: [id number] Arrival date & time: 07/05/22  7253     History  Chief Complaint  Patient presents with   Oral Swelling    Dylan Fowler is a 37 y.o. male.  HPI Patient presents for facial swelling.  Medical history includes prior dental abscess.  Tooth pain worsened overnight.  He was seen in the ED 2 months ago for dental abscess.  He was transferred to [facility] for ENT evaluation.  At [facility], he underwent incision and drainage of the mandibular abscess and was placed on 10 days of Augmentin at that time.  He states that he completed the course of antibiotics.  He was advised to follow-up with ENT after this hospital visit, however, he did not.  He states that he was not able to obtain a ride.  Last night, patient had recurrence of pain in the same area in his left lower molar.  This morning, he had recurrence of swelling to the submandibular area on that side.  For this reason, he presents the ED today.    Home Medications Prior to Admission medications   Medication Sig Start Date End Date Taking? Authorizing Provider  amoxicillin-clavulanate (AUGMENTIN) 875-125 MG tablet Take 1 tablet by mouth every 12 (twelve) hours. 07/05/22  Yes Gloris Manchester, MD  predniSONE (DELTASONE) 10 MG tablet Take 4 tablets (40 mg total) by mouth daily for 4 days. 07/05/22 07/09/22 Yes Gloris Manchester, MD  acetaminophen (TYLENOL) 500 MG tablet Take 500 mg by mouth every 6 (six) hours as needed for moderate pain.    [provider]      Allergies    Tomato    Review of Systems   Review of Systems  HENT:  Positive for dental problem, facial swelling and trouble swallowing.   All other systems reviewed and are negative.   Physical Exam Updated Vital Signs BP (!) 143/103   Pulse 97   Temp 97.9 F (36.6 C) (Oral)   Resp 17   Ht 5\' 7"  (1.702 m)   Wt 56.7 kg   SpO2 99%   BMI 19.58 kg/m  Physical Exam Vitals and  nursing note reviewed.  Constitutional:      General: He is not in acute distress.    Appearance: Normal appearance. He is well-developed. He is not ill-appearing, toxic-appearing or diaphoretic.  HENT:     Head: Normocephalic and atraumatic.     Right Ear: External ear normal.     Left Ear: External ear normal.     Nose: Nose normal.     Mouth/Throat:     Mouth: Mucous membranes are moist.     Comments: 1 finger trismus.  Swelling to submandibular area on left side in region of mandibular angle.  Does not cross midline. Eyes:     Extraocular Movements: Extraocular movements intact.     Conjunctiva/sclera: Conjunctivae normal.  Cardiovascular:     Rate and Rhythm: Normal rate and regular rhythm.  Pulmonary:     Effort: Pulmonary effort is normal. No respiratory distress.  Abdominal:     General: There is no distension.     Palpations: Abdomen is soft.     Tenderness: There is no abdominal tenderness.  Musculoskeletal:        General: No swelling. Normal range of motion.     Cervical back: Normal range of motion and neck supple.     Right lower leg: No edema.  Left lower leg: No edema.  Skin:    General: Skin is warm and dry.     Coloration: Skin is not jaundiced or pale.  Neurological:     General: No focal deficit present.     Mental Status: He is alert and oriented to person, place, and time.  Psychiatric:        Mood and Affect: Mood normal.        Behavior: Behavior normal.     ED Results / Procedures / Treatments   Labs (all labs ordered are listed, but only abnormal results are displayed) Labs Reviewed  COMPREHENSIVE METABOLIC PANEL - Abnormal; Notable for the following components:      Result Value   Sodium 130 (*)    Chloride 96 (*)    CO2 19 (*)    AST 49 (*)    Total Bilirubin 2.3 (*)    All other components within normal limits  CBC WITH DIFFERENTIAL/PLATELET - Abnormal; Notable for the following components:   WBC 11.3 (*)    Monocytes Absolute 1.6  (*)    All other components within normal limits  I-STAT CHEM 8, ED - Abnormal; Notable for the following components:   Sodium 132 (*)    Creatinine, Ser 0.60 (*)    TCO2 21 (*)    Hemoglobin 17.7 (*)    All other components within normal limits  LACTIC ACID, PLASMA  LACTIC ACID, PLASMA    EKG EKG Interpretation  Date/Time:  Monday July 05 2022 08:37:22 EDT Ventricular Rate:  94 PR Interval:  172 QRS Duration: 89 QT Interval:  348 QTC Calculation: 436 R Axis:   73 Text Interpretation: Sinus rhythm Right atrial enlargement ST elev, probable normal early repol pattern Confirmed by Gloris Manchester [phone]) on 07/05/2022 9:39:35 AM  Radiology CT Maxillofacial W Contrast  Result Date: 07/05/2022 CLINICAL DATA:  Provided history: Sublingual/submandibular abscess. Additional history provided: Patient diagnosed with regional infection/cellulitis due to poor dentition in February of 2024. Pain and swelling in left side of face which began this morning. Difficulty opening mouth. EXAM: CT MAXILLOFACIAL WITH CONTRAST TECHNIQUE: Multidetector CT imaging of the maxillofacial structures was performed with intravenous contrast. Multiplanar CT image reconstructions were also generated. RADIATION DOSE REDUCTION: This exam was performed according to the departmental dose-optimization program which includes automated exposure control, adjustment of the mA and/or kV according to patient size and/or use of iterative reconstruction technique. CONTRAST:  75mL OMNIPAQUE IOHEXOL 300 MG/ML  SOLN COMPARISON:  Neck CT 05/06/2022. FINDINGS: Osseous: Poor dentition with multiple carious teeth and multifocal periapical lucency. Most notably in the region of concern, there is redemonstrated prominent periapical lucency surrounding the left mandibular third molar (for instance as seen on series 7, image 57). Orbits: No orbital mass or acute orbital finding. Sinuses: Minimal mucosal thickening scattered within the paranasal  sinuses. Soft tissues: 10 x 4 x 8 mm abscess along the lingual surface of the posterior mandible, decreased in size from the prior examination of 05/06/2022 (series 4, image 46) (series 2, image 57). Edema and inflammatory stranding within the left face, parapharyngeal space, floor of mouth and submandibular space. This includes edema within the left submandibular gland. Edema also present within the left pterygoid musculature. Enlarged left level 2 lymph node measuring 1.4 cm in short axis, likely reactive. Limited intracranial: No evidence of an acute intracranial abnormality within the field of view. IMPRESSION: 1. 10 x 4 x 8 mm odontogenic abscess along the lingual surface of the posterior mandible on  the left (adjacent to the left mandibular third molar, which is carious with prominent surrounding periapical lucency). This abscess is smaller than on the prior neck CT of 05/06/2022, and may be residual or recurrent. Adjacent soft tissue infection/cellulitis within the left face, parapharyngeal space, floor of mouth and submandibular space. 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 2. Enlarged left level 2 lymph node, likely reactive. Electronically Signed   By: Jackey Loge D.O.   On: 07/05/2022 11:04    Procedures Procedures    Medications Ordered in ED Medications  oxyCODONE-acetaminophen (PERCOCET/ROXICET) 5-325 MG per tablet 1 tablet (has no administration in time range)  ketorolac (TORADOL) 15 MG/ML injection 15 mg (has no administration in time range)  lactated ringers bolus 1,000 mL (0 mLs Intravenous Stopped 07/05/22 1150)  Ampicillin-Sulbactam (UNASYN) 3 g in sodium chloride 0.9 % 100 mL IVPB (0 g Intravenous Stopped 07/05/22 1046)  dexamethasone (DECADRON) injection 10 mg (10 mg Intravenous Given 07/05/22 0849)  fentaNYL (SUBLIMAZE) injection 50 mcg (50 mcg Intravenous Given 07/05/22 0846)  iohexol (OMNIPAQUE) 300 MG/ML solution 75 mL (75 mLs Intravenous Contrast Given  07/05/22 1023)    ED Course/ Medical Decision Making/ A&P                             Medical Decision Making Amount and/or Complexity of Data Reviewed Labs: ordered. Radiology: ordered. ECG/medicine tests: ordered.  Risk Prescription drug management.   This patient presents to the ED for concern of facial swelling, this involves an extensive number of treatment options, and is a complaint that carries with it a high risk of complications and morbidity.  The differential diagnosis includes odontogenic infection, abscess, Ludwig's angina, allergic reaction   Co morbidities that complicate the patient evaluation  Prior odontogenic abscess   Additional history obtained:  Additional history obtained from N/A External records from outside source obtained and reviewed including EMR   Lab Tests:  I Ordered, and personally interpreted labs.  The pertinent results include:, Normal lactate, normal hemoglobin   Imaging Studies ordered:  I ordered imaging studies including CT face I independently visualized and interpreted imaging which showed 10 x 4 x 8 mm abscess along the surface of posterior mandible on the left with adjacent cellulitis I agree with the radiologist interpretation   Cardiac Monitoring: / EKG:  The patient was maintained on a cardiac monitor.  I personally viewed and interpreted the cardiac monitored which showed an underlying rhythm of: Sinus rhythm   Consultations Obtained:  I requested consultation with the otolaryngologist, Dr. Elijah Birk,  and discussed lab and imaging findings as well as pertinent plan - they recommend: Post dental follow-up I requested consultation with the dentist,Dr. Mia Creek,  and discussed lab and imaging findings as well as pertinent plan - they recommend: Close follow-up in office   Problem List / ED Course / Critical interventions / Medication management  Patient presents for recurrence of left mandibular swelling.  He had an  episode of this 2 months ago and had to undergo transfer to [facility] for incision and drainage.  He underwent course of antibiotics and did not follow-up with ENT or dentistry.  He now presents for recurrence of pain starting yesterday, and recurrence of swelling starting this morning.  Vital signs on arrival are notable for hypertension and tachycardia.  On exam, patient has swelling to the submandibular area at left angle of mandible.  He has 1 finger trismus on exam.  Decadron, Unasyn, and pain medication were ordered.  Laboratory workup and CT imaging were ordered as well.  CT scan showed a small, subcentimeter abscess along surface of posterior mandible with adjacent cellulitis.  This was discussed with ENT as well as dentistry.  Plan will be for close dental follow-up.  Patient was provided with contact information and advised to call dentist to soon as possible.  Dentist is confident that he can get patient seen within the next 1 to 2 days.  Patient was provided with prescriptions for ongoing steroids and antibiotics.  He was advised to return for any worsening.  He was discharged in stable condition. I ordered medication including IVF, Decadron, Unasyn for dental genic infection; fentanyl for analgesia Reevaluation of the patient after these medicines showed that the patient improved I have reviewed the patients home medicines and have made adjustments as needed   Social Determinants of Health:  Does not have PCP        Final Clinical Impression(s) / ED Diagnoses Final diagnoses:  Tooth abscess    Rx / DC Orders ED Discharge Orders          Ordered    amoxicillin-clavulanate (AUGMENTIN) 875-125 MG tablet  Every 12 hours        07/05/22 1328    predniSONE (DELTASONE) 10 MG tablet  Daily        07/05/22 1328              Gloris Manchester, MD 07/05/22 1328

## 2022-07-05 NOTE — ED Notes (Signed)
Pt resting in room at this time.

## 2022-07-05 NOTE — ED Notes (Signed)
Pt states he has a tooth that he is in the process of having pulled but hasn't been able to get it scheduled as of yet. Says the pain was "coming and going" but had gotten better until yesterday. States he woke up after 1am with pain and throbbing on left side of mouth and OTC medications haven't worked for his pain.

## 2022-07-05 NOTE — ED Triage Notes (Signed)
Pt c/o swelling and pain to left side of throat and neck that started suddenly last night. Pt also reports he can't open his mouth very much due to pain. Denies fever and difficulty breathing. Pt reports he was sent to [facility] last month due to an abscess that had to be drained and was told to come back to the ED if the pain and swelling were to come back. Pt completed his course of antibiotics he was given when he had the abscess last month.

## 2022-07-05 NOTE — ED Notes (Signed)
Pt received meds again for pain. Staying for few minutes to make sure of no adverse reactions before discharging completely

## 2022-07-11 ENCOUNTER — Emergency Department (HOSPITAL_COMMUNITY): Payer: Managed Care, Other (non HMO)

## 2022-07-11 ENCOUNTER — Other Ambulatory Visit: Payer: Self-pay

## 2022-07-11 ENCOUNTER — Encounter (HOSPITAL_COMMUNITY): Payer: Self-pay

## 2022-07-11 ENCOUNTER — Emergency Department (HOSPITAL_COMMUNITY)
Admission: EM | Admit: 2022-07-11 | Discharge: 2022-07-11 | Disposition: A | Discharge status: Home / Self Care | Payer: Managed Care, Other (non HMO) | Attending: Emergency Medicine | Admitting: Emergency Medicine

## 2022-07-11 DIAGNOSIS — K122 Cellulitis and abscess of mouth: Secondary | ICD-10-CM | POA: Diagnosis not present

## 2022-07-11 DIAGNOSIS — R220 Localized swelling, mass and lump, head: Secondary | ICD-10-CM | POA: Diagnosis present

## 2022-07-11 LAB — CBC WITH DIFFERENTIAL/PLATELET
Abs Immature Granulocytes: 0.08 10*3/uL — ABNORMAL HIGH (ref 0.00–0.07)
Basophils Absolute: 0.1 10*3/uL (ref 0.0–0.1)
Basophils Relative: 1 %
Eosinophils Absolute: 0 10*3/uL (ref 0.0–0.5)
Eosinophils Relative: 0 %
HCT: 45.1 % (ref 39.0–52.0)
Hemoglobin: 15.4 g/dL (ref 13.0–17.0)
Immature Granulocytes: 1 %
Lymphocytes Relative: 22 %
Lymphs Abs: 3.7 10*3/uL (ref 0.7–4.0)
MCH: 32 pg (ref 26.0–34.0)
MCHC: 34.1 g/dL (ref 30.0–36.0)
MCV: 93.6 fL (ref 80.0–100.0)
Monocytes Absolute: 2.9 10*3/uL — ABNORMAL HIGH (ref 0.1–1.0)
Monocytes Relative: 17 %
Neutro Abs: 10.4 10*3/uL — ABNORMAL HIGH (ref 1.7–7.7)
Neutrophils Relative %: 59 %
Platelets: 330 10*3/uL (ref 150–400)
RBC: 4.82 MIL/uL (ref 4.22–5.81)
RDW: 11.8 % (ref 11.5–15.5)
WBC: 17.3 10*3/uL — ABNORMAL HIGH (ref 4.0–10.5)
nRBC: 0 % (ref 0.0–0.2)

## 2022-07-11 LAB — BASIC METABOLIC PANEL
Anion gap: 10 (ref 5–15)
BUN: 11 mg/dL (ref 6–20)
CO2: 25 mmol/L (ref 22–32)
Calcium: 9.3 mg/dL (ref 8.9–10.3)
Chloride: 94 mmol/L — ABNORMAL LOW (ref 98–111)
Creatinine, Ser: 0.62 mg/dL (ref 0.61–1.24)
GFR, Estimated: >60 mL/min (ref >60)
Glucose, Bld: 113 mg/dL — ABNORMAL HIGH (ref 70–99)
Potassium: 3.7 mmol/L (ref 3.5–5.1)
Sodium: 129 mmol/L — ABNORMAL LOW (ref 135–145)

## 2022-07-11 MED ORDER — SODIUM CHLORIDE 0.9 % IV BOLUS
1000.0000 mL | Freq: Once | INTRAVENOUS | Status: AC
  Administered 2022-07-11: 1000 mL via INTRAVENOUS

## 2022-07-11 MED ORDER — SODIUM CHLORIDE 0.9 % IV SOLN
3.0000 g | Freq: Once | INTRAVENOUS | Status: AC
  Administered 2022-07-11: 3 g via INTRAVENOUS
  Filled 2022-07-11: qty 8

## 2022-07-11 MED ORDER — SODIUM CHLORIDE 0.9 % IV SOLN
3.0000 g | Freq: Four times a day (QID) | INTRAVENOUS | Status: DC
  Administered 2022-07-11: 3 g via INTRAVENOUS
  Filled 2022-07-11: qty 8

## 2022-07-11 MED ORDER — DEXAMETHASONE SODIUM PHOSPHATE 10 MG/ML IJ SOLN
10.0000 mg | Freq: Once | INTRAMUSCULAR | Status: AC
  Administered 2022-07-11: 10 mg via INTRAVENOUS
  Filled 2022-07-11: qty 1

## 2022-07-11 MED ORDER — IOHEXOL 300 MG/ML  SOLN
75.0000 mL | Freq: Once | INTRAMUSCULAR | Status: AC | PRN
  Administered 2022-07-11: 75 mL via INTRAVENOUS

## 2022-07-11 MED ORDER — KETOROLAC TROMETHAMINE 15 MG/ML IJ SOLN
15.0000 mg | Freq: Once | INTRAMUSCULAR | Status: AC
  Administered 2022-07-11: 15 mg via INTRAVENOUS
  Filled 2022-07-11: qty 1

## 2022-07-11 NOTE — ED Notes (Signed)
Provided pt with warm blanket. No additional needs or concerns noted.

## 2022-07-11 NOTE — ED Triage Notes (Signed)
Patient presents to ED via POV,  c/o left-sided facial swelling that started last week. Patient states that he is having trouble opening his mouth. Denies difficulty breathing but left side of throat sore when swallowing.

## 2022-07-11 NOTE — ED Provider Notes (Signed)
[place] EMERGENCY DEPARTMENT AT [facility] Provider Note   CSN: [id number] Arrival date & time: 07/11/22  1022     History  Chief Complaint  Patient presents with   Facial Swelling    Dylan Fowler is a 37 y.o. male.  History of recurrent dental abscess.  Presents today for worsening left submandibular pain and swelling along with trismus.  He is able to drink liquids but not able to eat foods with past 2 days he is having severe pain.  He has been compliant with the Augmentin prescribed 6 days ago but did not take his dose this morning. Does admit to taking his grandmothers oxycodone this morning, states she gave him 1 because he was having severe pain did not have anything to take.  Been trying to get into a dentist but nobody will take his insurance.  He did finally see a dentist in [facility], they did an x-ray but stated he needed and oral surgeon.  Denies fever or chills.  HPI     Home Medications Prior to Admission medications   Medication Sig Start Date End Date Taking? Authorizing Provider  acetaminophen (TYLENOL) 500 MG tablet Take 500 mg by mouth every 6 (six) hours as needed for moderate pain.    [provider]  amoxicillin-clavulanate (AUGMENTIN) 875-125 MG tablet Take 1 tablet by mouth every 12 (twelve) hours. 07/05/22   Gloris Manchester, MD      Allergies    Tomato    Review of Systems   Review of Systems  Physical Exam Updated Vital Signs BP (!) 132/92 (BP Location: Right Arm)   Pulse 76   Temp 98 F (36.7 C) (Axillary)   Resp 14   Ht 5\' 7"  (1.702 m)   Wt 58.5 kg   SpO2 99%   BMI 20.20 kg/m  Physical Exam Vitals and nursing note reviewed.  Constitutional:      General: He is not in acute distress.    Appearance: He is well-developed.  HENT:     Head: Normocephalic and atraumatic.  Eyes:     Conjunctiva/sclera: Conjunctivae normal.  Cardiovascular:     Rate and Rhythm: Normal rate and regular rhythm.     Heart  sounds: No murmur heard. Pulmonary:     Effort: Pulmonary effort is normal. No respiratory distress.     Breath sounds: Normal breath sounds.  Abdominal:     Palpations: Abdomen is soft.     Tenderness: There is no abdominal tenderness.  Musculoskeletal:        General: No swelling.     Cervical back: Neck supple.  Skin:    General: Skin is warm and dry.     Capillary Refill: Capillary refill takes less than 2 seconds.  Neurological:     Mental Status: He is alert.  Psychiatric:        Mood and Affect: Mood normal.     ED Results / Procedures / Treatments   Labs (all labs ordered are listed, but only abnormal results are displayed) Labs Reviewed  CBC WITH DIFFERENTIAL/PLATELET - Abnormal; Notable for the following components:      Result Value   WBC 17.3 (*)    Neutro Abs 10.4 (*)    Monocytes Absolute 2.9 (*)    Abs Immature Granulocytes 0.08 (*)    All other components within normal limits  BASIC METABOLIC PANEL - Abnormal; Notable for the following components:   Sodium 129 (*)    Chloride 94 (*)  Glucose, Bld 113 (*)    All other components within normal limits    EKG None  Radiology CT Soft Tissue Neck W Contrast  Result Date: 07/11/2022 CLINICAL DATA:  Soft tissue infection suspected, neck, xray done. Left-sided facial swelling the started last week, now with difficulty opening mouth. EXAM: CT NECK WITH CONTRAST TECHNIQUE: Multidetector CT imaging of the neck was performed using the standard protocol following the bolus administration of intravenous contrast. RADIATION DOSE REDUCTION: This exam was performed according to the departmental dose-optimization program which includes automated exposure control, adjustment of the mA and/or kV according to patient size and/or use of iterative reconstruction technique. CONTRAST:  75mL OMNIPAQUE IOHEXOL 300 MG/ML  SOLN COMPARISON:  Neck CT 05/06/2022. FINDINGS: Pharynx and larynx: Edema in the left parapharyngeal space  secondary to multiloculated collection in the left submandibular space extending superiorly into the masticator space. The more superior component measures up to 1.7 x 1.5 cm (image 37 series 2). The more inferior component measures up to 2.5 x 1.3 cm (image 46 series 2). Reactive edema extends along the submucosal space of the left oropharynx and hypopharynx. Salivary glands: The left submandibular gland is displaced inferiorly and laterally by the above described fluid collection in the submandibular space. Thyroid: Normal. Lymph nodes: Reactive left cervical lymphadenopathy. Vascular: Unremarkable. Limited intracranial: Unremarkable. Visualized orbits: Normal. Mastoids and visualized paranasal sinuses: Well aerated. Skeleton: Impacted left third mandibular molar with extensive dental caries and periapical lucency with medial cortical breakthrough, likely giving rise to the abscesses described above. Upper chest: Unremarkable. Other: None. IMPRESSION: Multiloculated odontogenic abscess involving the left submandibular and left masticator spaces with reactive edema in the left parapharyngeal space and reactive left cervical lymphadenopathy. Electronically Signed   By: Orvan Falconer M.D.   On: 07/11/2022 18:02    Procedures Procedures    Medications Ordered in ED Medications  Ampicillin-Sulbactam (UNASYN) 3 g in sodium chloride 0.9 % 100 mL IVPB (0 g Intravenous Stopped 07/11/22 1310)  ketorolac (TORADOL) 15 MG/ML injection 15 mg (15 mg Intravenous Given 07/11/22 1237)  dexamethasone (DECADRON) injection 10 mg (10 mg Intravenous Given 07/11/22 1239)  sodium chloride 0.9 % bolus 1,000 mL (0 mLs Intravenous Stopped 07/11/22 1835)  iohexol (OMNIPAQUE) 300 MG/ML solution 75 mL (75 mLs Intravenous Contrast Given 07/11/22 1721)    ED Course/ Medical Decision Making/ A&P                             Medical Decision Making This patient presents to the ED for concern of left neck and jaw swelling that has been  worsening, this involves an extensive number of treatment options, and is a complaint that carries with it a high risk of complications and morbidity.  The differential diagnosis includes dental abscess, Ludwig's angina, retropharyngeal abscess, other   Co morbidities that complicate the patient evaluation  Prior submandibular abscess requiring needle aspiration drainage at [facility] by ENT   Additional history obtained:  Additional history obtained from EMR External records from outside source obtained and reviewed including ENT note for needle aspiration, recent ED visit for same complaint today   Lab Tests:  I Ordered, and personally interpreted labs.  The pertinent results include: CBC and BMP ordered today, sodium is 129 this is around his baseline, WBCs are 17 though he has been taking dexamethasone recently as well which may be part of why it has increased   Imaging Studies ordered:  I  ordered imaging studies including CT soft tissue neck with contrast I independently visualized and interpreted imaging which showed a loculated left submandibular abscess I agree with the radiologist interpretation as follows  IMPRESSION: Multiloculated odontogenic abscess involving the left submandibular and left masticator spaces with reactive edema in the left parapharyngeal space and reactive left cervical lymphadenopathy.    Cardiac Monitoring: / EKG:    Consultations Obtained:  I requested consultation with the T Dr. Jearld Fenton,  and discussed lab and imaging findings as well as pertinent plan - they recommend: Transfer to [facility] where he had a previous procedure, he states he does not do procedures related to dental infections.   Problem List / ED Course / Critical interventions / Medication management  Has a enlarging left submandibular abscess, he has got trismus to about 1 fingerbreadth.  He he had improvement of the pain and can open approximately another  half centimeter than previous after the IV dexamethasone.  He also got IV Unasyn.  Plan to transfer to [facility]  Reevaluation of the patient after these medicines showed that the patient improved I have reviewed the patients home medicines and have made adjustments as needed   Social Determinants of Health:  Does not have driver's license      Amount and/or Complexity of Data Reviewed Labs: ordered. Radiology: ordered.  Risk Prescription drug management.   Signed out to [employer] PA-C        Final Clinical Impression(s) / ED Diagnoses Final diagnoses:  None    Rx / DC Orders ED Discharge Orders     None         Ma Rings, PA-C 07/11/22 1941    Loetta Rough, MD 07/12/22 1500

## 2022-07-11 NOTE — ED Provider Notes (Signed)
Patient signed out to me by Shawna Clamp, PA-C pending consultation [facility]   Patient here with multi loculated odontogenic abscess involving the submandibular and left masticator spaces.  He was seen at [facility] in March and had needle aspiration in the ED.  Unfortunately, due to lack of financial resources patient was unable to follow-up with dentistry.  His symptoms from March resolved and recently returned several days ago.  Seen here on 4/29 and was prescribed Augmentin and prednisone.  He returns today due to worsening symptoms with 1 finger breadth trismus. ENT in [place] was consulted, and advised that they do not cover procedures related to dental infections and was recommended to consult with [facility]   Consulted with ENT at [facility], Dr. Jose Persia who recommends patient be transferred to the ED at [facility], n.p.o. after midnight     CT Soft Tissue Neck W Contrast  Result Date: 07/11/2022 CLINICAL DATA:  Soft tissue infection suspected, neck, xray done. Left-sided facial swelling the started last week, now with difficulty opening mouth. EXAM: CT NECK WITH CONTRAST TECHNIQUE: Multidetector CT imaging of the neck was performed using the standard protocol following the bolus administration of intravenous contrast. RADIATION DOSE REDUCTION: This exam was performed according to the departmental dose-optimization program which includes automated exposure control, adjustment of the mA and/or kV according to patient size and/or use of iterative reconstruction technique. CONTRAST:  75mL OMNIPAQUE IOHEXOL 300 MG/ML  SOLN COMPARISON:  Neck CT 05/06/2022. FINDINGS: Pharynx and larynx: Edema in the left parapharyngeal space secondary to multiloculated collection in the left submandibular space extending superiorly into the masticator space. The more superior component measures up to 1.7 x 1.5 cm (image 37 series 2). The more inferior component  measures up to 2.5 x 1.3 cm (image 46 series 2). Reactive edema extends along the submucosal space of the left oropharynx and hypopharynx. Salivary glands: The left submandibular gland is displaced inferiorly and laterally by the above described fluid collection in the submandibular space. Thyroid: Normal. Lymph nodes: Reactive left cervical lymphadenopathy. Vascular: Unremarkable. Limited intracranial: Unremarkable. Visualized orbits: Normal. Mastoids and visualized paranasal sinuses: Well aerated. Skeleton: Impacted left third mandibular molar with extensive dental caries and periapical lucency with medial cortical breakthrough, likely giving rise to the abscesses described above. Upper chest: Unremarkable. Other: None. IMPRESSION: Multiloculated odontogenic abscess involving the left submandibular and left masticator spaces with reactive edema in the left parapharyngeal space and reactive left cervical lymphadenopathy. Electronically Signed   By: Orvan Falconer M.D.   On: 07/11/2022 18:02   CT Maxillofacial W Contrast  Result Date: 07/05/2022 CLINICAL DATA:  Provided history: Sublingual/submandibular abscess. Additional history provided: Patient diagnosed with regional infection/cellulitis due to poor dentition in February of 2024. Pain and swelling in left side of face which began this morning. Difficulty opening mouth. EXAM: CT MAXILLOFACIAL WITH CONTRAST TECHNIQUE: Multidetector CT imaging of the maxillofacial structures was performed with intravenous contrast. Multiplanar CT image reconstructions were also generated. RADIATION DOSE REDUCTION: This exam was performed according to the departmental dose-optimization program which includes automated exposure control, adjustment of the mA and/or kV according to patient size and/or use of iterative reconstruction technique. CONTRAST:  75mL OMNIPAQUE IOHEXOL 300 MG/ML  SOLN COMPARISON:  Neck CT 05/06/2022. FINDINGS: Osseous: Poor dentition with multiple carious  teeth and multifocal periapical lucency. Most notably in the region of concern, there is redemonstrated prominent periapical lucency surrounding the left mandibular third molar (for instance as seen on series 7,  image 57). Orbits: No orbital mass or acute orbital finding. Sinuses: Minimal mucosal thickening scattered within the paranasal sinuses. Soft tissues: 10 x 4 x 8 mm abscess along the lingual surface of the posterior mandible, decreased in size from the prior examination of 05/06/2022 (series 4, image 46) (series 2, image 57). Edema and inflammatory stranding within the left face, parapharyngeal space, floor of mouth and submandibular space. This includes edema within the left submandibular gland. Edema also present within the left pterygoid musculature. Enlarged left level 2 lymph node measuring 1.4 cm in short axis, likely reactive. Limited intracranial: No evidence of an acute intracranial abnormality within the field of view. IMPRESSION: 1. 10 x 4 x 8 mm odontogenic abscess along the lingual surface of the posterior mandible on the left (adjacent to the left mandibular third molar, which is carious with prominent surrounding periapical lucency). This abscess is smaller than on the prior neck CT of 05/06/2022, and may be residual or recurrent. Adjacent soft tissue infection/cellulitis within the left face, parapharyngeal space, floor of mouth and submandibular space. 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 2. Enlarged left level 2 lymph node, likely reactive. Electronically Signed   By: Jackey Loge D.O.   On: 07/05/2022 11:04      Pauline Aus, PA-C 07/11/22 2037    Loetta Rough, MD 07/12/22 1500

## 2022-07-11 NOTE — ED Notes (Signed)
[facility] RN

## 2023-02-20 ENCOUNTER — Emergency Department (HOSPITAL_COMMUNITY)
Admission: EM | Admit: 2023-02-20 | Discharge: 2023-02-20 | Disposition: A | Discharge status: Home / Self Care | Payer: Managed Care, Other (non HMO) | Attending: Emergency Medicine | Admitting: Emergency Medicine

## 2023-02-20 ENCOUNTER — Encounter (HOSPITAL_COMMUNITY): Payer: Self-pay

## 2023-02-20 ENCOUNTER — Other Ambulatory Visit: Payer: Self-pay

## 2023-02-20 ENCOUNTER — Emergency Department (HOSPITAL_COMMUNITY): Payer: Managed Care, Other (non HMO)

## 2023-02-20 DIAGNOSIS — Z23 Encounter for immunization: Secondary | ICD-10-CM | POA: Insufficient documentation

## 2023-02-20 DIAGNOSIS — S0993XA Unspecified injury of face, initial encounter: Secondary | ICD-10-CM | POA: Diagnosis present

## 2023-02-20 DIAGNOSIS — S50312A Abrasion of left elbow, initial encounter: Secondary | ICD-10-CM | POA: Diagnosis not present

## 2023-02-20 DIAGNOSIS — S59902A Unspecified injury of left elbow, initial encounter: Secondary | ICD-10-CM

## 2023-02-20 DIAGNOSIS — S01412A Laceration without foreign body of left cheek and temporomandibular area, initial encounter: Secondary | ICD-10-CM | POA: Diagnosis not present

## 2023-02-20 DIAGNOSIS — S0181XA Laceration without foreign body of other part of head, initial encounter: Secondary | ICD-10-CM

## 2023-02-20 MED ORDER — TETANUS-DIPHTH-ACELL PERTUSSIS 5-2.5-18.5 LF-MCG/0.5 IM SUSY
0.5000 mL | PREFILLED_SYRINGE | Freq: Once | INTRAMUSCULAR | Status: AC
  Administered 2023-02-20: 0.5 mL via INTRAMUSCULAR
  Filled 2023-02-20: qty 0.5
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
## 2023-04-16 ENCOUNTER — Emergency Department (HOSPITAL_COMMUNITY)
Admission: EM | Admit: 2023-04-16 | Discharge: 2023-04-16 | Disposition: A | Discharge status: Home / Self Care | Payer: Self-pay | Attending: Emergency Medicine | Admitting: Emergency Medicine

## 2023-04-16 DIAGNOSIS — K0889 Other specified disorders of teeth and supporting structures: Secondary | ICD-10-CM | POA: Insufficient documentation

## 2023-04-16 MED ORDER — IBUPROFEN 800 MG PO TABS: 800.0000 mg | ORAL_TABLET | Freq: Three times a day (TID) | ORAL | 0 refills | Status: AC

## 2023-04-16 MED ORDER — CLINDAMYCIN PHOSPHATE 600 MG/50ML IV SOLN
600.0000 mg | Freq: Once | INTRAVENOUS | Status: AC
  Administered 2023-04-16: 600 mg via INTRAVENOUS
  Filled 2023-04-16: qty 50

## 2023-04-16 MED ORDER — PENICILLIN V POTASSIUM 500 MG PO TABS: 500.0000 mg | ORAL_TABLET | Freq: Three times a day (TID) | ORAL | 0 refills | Status: AC

## 2023-04-16 NOTE — ED Triage Notes (Signed)
 Pt reports having R lower dental pain that started yesterday. Pt woke up this morning with significant swelling to that area. Known tooth issue in affected area. Pt denies fever, trouble breathing, swallowing.

## 2023-04-16 NOTE — Discharge Instructions (Signed)
 You have been given IV antibiotics here today for your dental pain.  You likely have a developing dental infection.  It is very important that you take the antibiotics as directed until finished.  You may try warm salt water rinses or half peroxide half water rinses 2-3 times a day.  You have been provided a pharmacist, hospital for local dentistry.  Please contact someone early next week to arrange follow-up appointment.  Return to the emergency department if you develop any new or worsening symptoms

## 2023-04-18 NOTE — ED Provider Notes (Cosign Needed)
 [place] EMERGENCY DEPARTMENT AT [facility] Provider Note   CSN: [id number] Arrival date & time: 04/16/23  1114     History  Chief Complaint  Patient presents with   Dental Problem    Dylan Fowler is a 38 y.o. male.  HPI      Dylan Fowler is a 38 y.o. male with prior medical history of previous dental abscess, who presents to the Emergency Department complaining of dental pain and facial swelling.  He endorses having dental pain to the right lower mouth for several days.  Noticed swelling of his right lower face upon waking.  Denies neck pain, fever, difficulty swallowing, and difficulty opening and closing his mouth.  No shortness of breath.  He states a previous dental abscess to the left side of his jaw required emergent surgical extraction and he states he is here for evaluation of his current symptoms before they worsen.  Does not currently have a dentist    Home Medications Prior to Admission medications   Medication Sig Start Date End Date Taking? Authorizing Provider  ibuprofen  (ADVIL ) 800 MG tablet Take 1 tablet (800 mg total) by mouth 3 (three) times daily. Take with food 04/16/23  Yes Larkin Morelos, PA-C  penicillin  v potassium (VEETID) 500 MG tablet Take 1 tablet (500 mg total) by mouth 3 (three) times daily. 04/16/23  Yes Jemel Ono, PA-C  acetaminophen  (TYLENOL ) 500 MG tablet Take 500 mg by mouth every 6 (six) hours as needed for moderate pain.    [provider]      Allergies    Tomato    Review of Systems   Review of Systems  Constitutional:  Negative for chills and fever.  HENT:  Positive for dental problem and facial swelling. Negative for ear pain, sore throat and trouble swallowing.   Eyes:  Negative for visual disturbance.  Respiratory:  Negative for cough and shortness of breath.   Gastrointestinal:  Negative for abdominal pain, nausea and vomiting.  Musculoskeletal:  Negative for neck pain.  Neurological:  Negative for  dizziness, weakness and headaches.    Physical Exam Updated Vital Signs BP (!) 112/99   Pulse 71   Temp 99.3 F (37.4 C) (Oral)   Resp 20   Ht 5' 7 (1.702 m)   Wt 56.7 kg   SpO2 99%   BMI 19.58 kg/m  Physical Exam Vitals and nursing note reviewed.  Constitutional:      General: He is not in acute distress.    Appearance: Normal appearance. He is not ill-appearing or toxic-appearing.  HENT:     Right Ear: Tympanic membrane and ear canal normal.     Left Ear: Tympanic membrane and ear canal normal.     Mouth/Throat:     Dentition: Dental caries present. No gingival swelling.     Pharynx: Oropharynx is clear. Uvula midline. No posterior oropharyngeal erythema or uvula swelling.     Comments: Dental caries with tenderness to palpation of the right lower first molar.  No erythema or edema of the surrounding gums.  No fluctuance.  Uvula midline no edema.  Mild right lower facial swelling, no trismus or submandibular swelling Cardiovascular:     Rate and Rhythm: Normal rate and regular rhythm.     Pulses: Normal pulses.  Musculoskeletal:     Cervical back: Normal range of motion. No rigidity.  Lymphadenopathy:     Cervical: No cervical adenopathy.  Neurological:     Mental Status: He is alert.  ED Results / Procedures / Treatments   Labs (all labs ordered are listed, but only abnormal results are displayed) Labs Reviewed - No data to display  EKG None  Radiology No results found.  Procedures Procedures    Medications Ordered in ED Medications  clindamycin  (CLEOCIN ) IVPB 600 mg (0 mg Intravenous Stopped 04/16/23 1607)    ED Course/ Medical Decision Making/ A&P                                 Medical Decision Making   Patient here with history of dental abscess that required surgical extraction.  Noticed dental pain on the right lower mouth for few days.  Woke with right sided facial swelling.  No neck pain, swelling, difficulty swallowing or breathing.  No  trismus  I suspect recurrent dental abscess.  Ludewig's angina also considered.  Airway is patent at this time.  Uvula is midline and he does not have sublingual abnormality or trismus Patient well-appearing.  No evidence of airway compromise.  No indication for need of imaging or labs at this time    Amount and/or Complexity of Data Reviewed Discussion of management or test interpretation with external provider(s):   Mild right lower facial swelling.  No submandibular pain or swelling.  No trismus on exam.  His vital signs are reassuring.  Patient does not currently have a dentist.  IV antibiotics given here.  He appears appropriate for discharge home at this time.  He is agreeable to close outpatient follow-up with local dentistry and prescription for oral antibiotics given.  Strict return precautions discussed  Risk Prescription drug management.           Final Clinical Impression(s) / ED Diagnoses Final diagnoses:  Pain, dental    Rx / DC Orders ED Discharge Orders          Ordered    penicillin  v potassium (VEETID) 500 MG tablet  3 times daily        04/16/23 1610    ibuprofen  (ADVIL ) 800 MG tablet  3 times daily        04/16/23 1610              Nayson Traweek, [place], PA-C 04/18/23 2001

## 2023-09-18 ENCOUNTER — Emergency Department (HOSPITAL_COMMUNITY): Payer: Self-pay

## 2023-09-18 ENCOUNTER — Other Ambulatory Visit: Payer: Self-pay

## 2023-09-18 ENCOUNTER — Encounter (HOSPITAL_COMMUNITY): Payer: Self-pay | Admitting: *Deleted

## 2023-09-18 ENCOUNTER — Emergency Department (HOSPITAL_COMMUNITY)
Admission: EM | Admit: 2023-09-18 | Discharge: 2023-09-18 | Disposition: A | Discharge status: Home / Self Care | Payer: Self-pay

## 2023-09-18 DIAGNOSIS — M25551 Pain in right hip: Secondary | ICD-10-CM | POA: Insufficient documentation

## 2023-09-18 DIAGNOSIS — W19XXXA Unspecified fall, initial encounter: Secondary | ICD-10-CM

## 2023-09-18 DIAGNOSIS — W138XXA Fall from, out of or through other building or structure, initial encounter: Secondary | ICD-10-CM | POA: Insufficient documentation

## 2023-09-18 MED ORDER — KETOROLAC TROMETHAMINE 15 MG/ML IJ SOLN
15.0000 mg | Freq: Once | INTRAMUSCULAR | Status: AC
  Administered 2023-09-18: 15 mg via INTRAVENOUS
  Filled 2023-09-18: qty 1

## 2023-09-18 MED ORDER — METHOCARBAMOL 500 MG PO TABS: 500.0000 mg | ORAL_TABLET | Freq: Four times a day (QID) | ORAL | 0 refills | Status: AC | PRN

## 2023-09-18 NOTE — Discharge Instructions (Signed)
 Use ice as needed for pain.  You should alternate Tylenol  and Motrin  every 3 hours throughout the day as needed for pain.  You can take Robaxin  as needed.  Your x-ray was negative for any fracture or abnormality of your bones.

## 2023-09-18 NOTE — ED Provider Notes (Signed)
 [place] EMERGENCY DEPARTMENT AT [facility] Provider Note   CSN: [id number] Arrival date & time: 09/18/23  1031     Patient presents with: Hip Pain   Dylan Fowler is a 38 y.o. male.   38 year old male presents for evaluation of right hip pain.  He had a fall 3 days ago and since then has had significant right hip pain had had trouble weightbearing.  Has been able to walk but is having pain radiating down to his knee.  He denies any other symptoms or concerns at this time.   Hip Pain Pertinent negatives include no chest pain, no abdominal pain and no shortness of breath.       Prior to Admission medications   Medication Sig Start Date End Date Taking? Authorizing Provider  methocarbamol  (ROBAXIN ) 500 MG tablet Take 1 tablet (500 mg total) by mouth every 6 (six) hours as needed for muscle spasms. 09/18/23  Yes Evonte Prestage L, DO  acetaminophen  (TYLENOL ) 500 MG tablet Take 500 mg by mouth every 6 (six) hours as needed for moderate pain.    [provider]  ibuprofen  (ADVIL ) 800 MG tablet Take 1 tablet (800 mg total) by mouth 3 (three) times daily. Take with food 04/16/23   Triplett, Tammy, PA-C  penicillin  v potassium (VEETID) 500 MG tablet Take 1 tablet (500 mg total) by mouth 3 (three) times daily. 04/16/23   Triplett, Tammy, PA-C    Allergies: Tomato    Review of Systems  Constitutional:  Negative for chills and fever.  HENT:  Negative for ear pain and sore throat.   Eyes:  Negative for pain and visual disturbance.  Respiratory:  Negative for cough and shortness of breath.   Cardiovascular:  Negative for chest pain and palpitations.  Gastrointestinal:  Negative for abdominal pain and vomiting.  Genitourinary:  Negative for dysuria and hematuria.  Musculoskeletal:  Negative for arthralgias and back pain.       Admits right hip pain  Skin:  Negative for color change and rash.  Neurological:  Negative for seizures and syncope.  All other systems  reviewed and are negative.   Updated Vital Signs BP (!) 149/99 (BP Location: Left Arm)   Pulse 73   Temp 98 F (36.7 C) (Oral)   Resp 18   SpO2 100%   Physical Exam Vitals and nursing note reviewed.  Constitutional:      General: He is not in acute distress.    Appearance: Normal appearance. He is well-developed. He is not ill-appearing.  HENT:     Head: Normocephalic and atraumatic.  Eyes:     Conjunctiva/sclera: Conjunctivae normal.  Cardiovascular:     Rate and Rhythm: Normal rate and regular rhythm.     Heart sounds: No murmur heard. Pulmonary:     Effort: Pulmonary effort is normal. No respiratory distress.     Breath sounds: Normal breath sounds.  Abdominal:     Palpations: Abdomen is soft.     Tenderness: There is no abdominal tenderness.  Musculoskeletal:        General: Tenderness present. No swelling.     Cervical back: Neck supple.     Comments: There is tenderness palpation over the right greater trochanter and the right femur, there is limited range of motion due to pain, there is no lower back or sacral pain on palpation  Skin:    General: Skin is warm and dry.     Capillary Refill: Capillary refill takes less than 2  seconds.  Neurological:     Mental Status: He is alert.  Psychiatric:        Mood and Affect: Mood normal.     (all labs ordered are listed, but only abnormal results are displayed) Labs Reviewed - No data to display  EKG: None  Radiology: DG FEMUR, MIN 2 VIEWS RIGHT Result Date: 09/18/2023 CLINICAL DATA:  [place].  Right leg pain. EXAM: RIGHT FEMUR 2 VIEWS COMPARISON:  None Available. FINDINGS: The right hip and knee joints are maintained. No acute femur fracture. IMPRESSION: No acute bony findings. Electronically Signed   By: MYRTIS Stammer M.D.   On: 09/18/2023 11:57   DG Pelvis 1-2 Views Result Date: 09/18/2023 CLINICAL DATA:  Clemens.  Right hip pain. EXAM: PELVIS - 1-2 VIEW COMPARISON:  None Available. FINDINGS: Both hips are normally  located. No hip fracture or AVN. The pubic symphysis and SI joints are intact. No pelvic fractures. IMPRESSION: No acute bony findings. Electronically Signed   By: MYRTIS Stammer M.D.   On: 09/18/2023 11:56     Procedures   Medications Ordered in the ED  ketorolac  (TORADOL ) 15 MG/ML injection 15 mg (15 mg Intravenous Given 09/18/23 1103)                                    Medical Decision Making Patient here for right hip pain after a fall 2 days ago.  Was given Toradol  here feeling much better.  Will give prescription for Robaxin  advised Tylenol  Motrin  as needed for pain.  Advised to return to the ER for any new worsening symptoms.  Imaging as below.  He feels comfortable being discharged home.  Problems Addressed: Fall, initial encounter: acute illness or injury Right hip pain: acute illness or injury  Amount and/or Complexity of Data Reviewed External Data Reviewed: notes. Radiology: ordered and independent interpretation performed. Decision-making details documented in ED Course.    Details: Imaging ordered and interpreted independently of radiology Right hip and femur x-rays show no acute bony abnormality  Risk OTC drugs. Prescription drug management.     Final diagnoses:  Right hip pain  Fall, initial encounter    ED Discharge Orders          Ordered    methocarbamol  (ROBAXIN ) 500 MG tablet  Every 6 hours PRN        09/18/23 1240               Noelly Lasseigne L, DO 09/18/23 1501

## 2023-09-18 NOTE — ED Triage Notes (Signed)
 Pt states he was playing with his kids a couple of days ago and fell on the porch, landing on his right hip  Pt states the pain has gotten worse and he is unable to sleep and has limited movement
